# Patient Record
Sex: Female | Born: 1955 | Race: Black or African American | Hispanic: No | Marital: Married | Smoking: Never smoker
Health system: Southern US, Community
[De-identification: ages and names within clinical notes are randomized; demographics above are authoritative.]

## PROBLEM LIST (undated history)

## (undated) DIAGNOSIS — E119 Type 2 diabetes mellitus without complications: Secondary | ICD-10-CM

## (undated) DIAGNOSIS — B029 Zoster without complications: Secondary | ICD-10-CM

## (undated) HISTORY — PX: CHOLECYSTECTOMY: SHX55

---

## 2000-12-30 ENCOUNTER — Other Ambulatory Visit: Admission: RE | Admit: 2000-12-30 | Discharge: 2000-12-30 | Payer: Self-pay | Admitting: Specialist

## 2001-01-18 ENCOUNTER — Encounter: Payer: Self-pay | Admitting: Internal Medicine

## 2001-01-18 ENCOUNTER — Ambulatory Visit (HOSPITAL_COMMUNITY): Admission: RE | Admit: 2001-01-18 | Discharge: 2001-01-18 | Payer: Self-pay | Admitting: Internal Medicine

## 2002-01-18 ENCOUNTER — Ambulatory Visit (HOSPITAL_COMMUNITY): Admission: RE | Admit: 2002-01-18 | Discharge: 2002-01-18 | Payer: Self-pay | Admitting: Family Medicine

## 2002-01-18 ENCOUNTER — Encounter: Payer: Self-pay | Admitting: Family Medicine

## 2003-01-19 ENCOUNTER — Encounter: Payer: Self-pay | Admitting: Family Medicine

## 2003-01-19 ENCOUNTER — Ambulatory Visit (HOSPITAL_COMMUNITY): Admission: RE | Admit: 2003-01-19 | Discharge: 2003-01-19 | Payer: Self-pay | Admitting: Family Medicine

## 2003-06-17 ENCOUNTER — Emergency Department (HOSPITAL_COMMUNITY): Admission: EM | Admit: 2003-06-17 | Discharge: 2003-06-18 | Payer: Self-pay | Admitting: Emergency Medicine

## 2004-10-30 ENCOUNTER — Emergency Department (HOSPITAL_COMMUNITY): Admission: EM | Admit: 2004-10-30 | Discharge: 2004-10-30 | Payer: Self-pay | Admitting: Emergency Medicine

## 2006-02-06 ENCOUNTER — Ambulatory Visit (HOSPITAL_COMMUNITY): Admission: RE | Admit: 2006-02-06 | Discharge: 2006-02-06 | Payer: Self-pay | Admitting: Family Medicine

## 2007-03-01 ENCOUNTER — Ambulatory Visit (HOSPITAL_COMMUNITY): Admission: RE | Admit: 2007-03-01 | Discharge: 2007-03-01 | Payer: Self-pay | Admitting: Family Medicine

## 2008-03-01 ENCOUNTER — Ambulatory Visit (HOSPITAL_COMMUNITY): Admission: RE | Admit: 2008-03-01 | Discharge: 2008-03-01 | Payer: Self-pay | Admitting: Family Medicine

## 2008-04-26 ENCOUNTER — Emergency Department (HOSPITAL_COMMUNITY): Admission: EM | Admit: 2008-04-26 | Discharge: 2008-04-26 | Payer: Self-pay | Admitting: Emergency Medicine

## 2008-12-26 ENCOUNTER — Inpatient Hospital Stay (HOSPITAL_COMMUNITY): Admission: EM | Admit: 2008-12-26 | Discharge: 2008-12-31 | Payer: Self-pay | Admitting: Emergency Medicine

## 2008-12-26 ENCOUNTER — Ambulatory Visit: Payer: Self-pay | Admitting: Pulmonary Disease

## 2009-05-09 ENCOUNTER — Ambulatory Visit (HOSPITAL_COMMUNITY): Admission: RE | Admit: 2009-05-09 | Discharge: 2009-05-09 | Payer: Self-pay | Admitting: Obstetrics & Gynecology

## 2009-06-18 ENCOUNTER — Ambulatory Visit (HOSPITAL_BASED_OUTPATIENT_CLINIC_OR_DEPARTMENT_OTHER): Admission: RE | Admit: 2009-06-18 | Discharge: 2009-06-18 | Payer: Self-pay | Admitting: Urology

## 2010-05-10 ENCOUNTER — Ambulatory Visit (HOSPITAL_COMMUNITY): Admission: RE | Admit: 2010-05-10 | Discharge: 2010-05-10 | Payer: Self-pay | Admitting: Obstetrics & Gynecology

## 2010-08-19 ENCOUNTER — Encounter: Payer: Self-pay | Admitting: Obstetrics & Gynecology

## 2010-10-30 LAB — GLUCOSE, CAPILLARY: Glucose-Capillary: 221 mg/dL — ABNORMAL HIGH (ref 70–99)

## 2010-10-30 LAB — POCT I-STAT 4, (NA,K, GLUC, HGB,HCT): HCT: 42 % (ref 36.0–46.0)

## 2010-11-04 LAB — BASIC METABOLIC PANEL
BUN: 14 mg/dL (ref 6–23)
BUN: 22 mg/dL (ref 6–23)
BUN: 25 mg/dL — ABNORMAL HIGH (ref 6–23)
BUN: 26 mg/dL — ABNORMAL HIGH (ref 6–23)
BUN: 4 mg/dL — ABNORMAL LOW (ref 6–23)
BUN: 6 mg/dL (ref 6–23)
BUN: 9 mg/dL (ref 6–23)
CO2: 15 mEq/L — ABNORMAL LOW (ref 19–32)
CO2: 16 mEq/L — ABNORMAL LOW (ref 19–32)
CO2: 18 mEq/L — ABNORMAL LOW (ref 19–32)
CO2: 19 mEq/L (ref 19–32)
CO2: 20 mEq/L (ref 19–32)
CO2: 20 mEq/L (ref 19–32)
CO2: 22 mEq/L (ref 19–32)
CO2: 23 mEq/L (ref 19–32)
CO2: 29 mEq/L (ref 19–32)
CO2: 6 mEq/L — CL (ref 19–32)
Calcium: 7.2 mg/dL — ABNORMAL LOW (ref 8.4–10.5)
Calcium: 7.4 mg/dL — ABNORMAL LOW (ref 8.4–10.5)
Calcium: 7.5 mg/dL — ABNORMAL LOW (ref 8.4–10.5)
Calcium: 7.5 mg/dL — ABNORMAL LOW (ref 8.4–10.5)
Calcium: 8.1 mg/dL — ABNORMAL LOW (ref 8.4–10.5)
Calcium: 8.7 mg/dL (ref 8.4–10.5)
Calcium: 9 mg/dL (ref 8.4–10.5)
Chloride: 102 mEq/L (ref 96–112)
Chloride: 105 mEq/L (ref 96–112)
Chloride: 106 mEq/L (ref 96–112)
Chloride: 108 mEq/L (ref 96–112)
Chloride: 110 mEq/L (ref 96–112)
Chloride: 111 mEq/L (ref 96–112)
Chloride: 115 mEq/L — ABNORMAL HIGH (ref 96–112)
Chloride: 118 mEq/L — ABNORMAL HIGH (ref 96–112)
Chloride: 120 mEq/L — ABNORMAL HIGH (ref 96–112)
Chloride: 124 mEq/L — ABNORMAL HIGH (ref 96–112)
Creatinine, Ser: 0.58 mg/dL (ref 0.4–1.2)
Creatinine, Ser: 0.61 mg/dL (ref 0.4–1.2)
Creatinine, Ser: 0.69 mg/dL (ref 0.4–1.2)
Creatinine, Ser: 0.93 mg/dL (ref 0.4–1.2)
Creatinine, Ser: 1.1 mg/dL (ref 0.4–1.2)
Creatinine, Ser: 1.55 mg/dL — ABNORMAL HIGH (ref 0.4–1.2)
Creatinine, Ser: 1.55 mg/dL — ABNORMAL HIGH (ref 0.4–1.2)
Creatinine, Ser: 1.66 mg/dL — ABNORMAL HIGH (ref 0.4–1.2)
GFR calc Af Amer: 42 mL/min — ABNORMAL LOW (ref >60)
GFR calc Af Amer: >60 mL/min (ref >60)
GFR calc Af Amer: >60 mL/min (ref >60)
GFR calc Af Amer: >60 mL/min (ref >60)
GFR calc Af Amer: >60 mL/min (ref >60)
GFR calc Af Amer: >60 mL/min (ref >60)
GFR calc Af Amer: >60 mL/min (ref >60)
GFR calc Af Amer: >60 mL/min (ref >60)
GFR calc Af Amer: >60 mL/min (ref >60)
GFR calc Af Amer: >60 mL/min (ref >60)
GFR calc Af Amer: >60 mL/min (ref >60)
GFR calc non Af Amer: 35 mL/min — ABNORMAL LOW (ref >60)
GFR calc non Af Amer: 55 mL/min — ABNORMAL LOW (ref >60)
GFR calc non Af Amer: >60 mL/min (ref >60)
GFR calc non Af Amer: >60 mL/min (ref >60)
GFR calc non Af Amer: >60 mL/min (ref >60)
GFR calc non Af Amer: >60 mL/min (ref >60)
GFR calc non Af Amer: >60 mL/min (ref >60)
Glucose, Bld: 163 mg/dL — ABNORMAL HIGH (ref 70–99)
Glucose, Bld: 177 mg/dL — ABNORMAL HIGH (ref 70–99)
Glucose, Bld: 232 mg/dL — ABNORMAL HIGH (ref 70–99)
Glucose, Bld: 236 mg/dL — ABNORMAL HIGH (ref 70–99)
Glucose, Bld: 242 mg/dL — ABNORMAL HIGH (ref 70–99)
Glucose, Bld: 318 mg/dL — ABNORMAL HIGH (ref 70–99)
Glucose, Bld: 585 mg/dL (ref 70–99)
Potassium: 3.1 mEq/L — ABNORMAL LOW (ref 3.5–5.1)
Potassium: 3.1 mEq/L — ABNORMAL LOW (ref 3.5–5.1)
Potassium: 3.3 mEq/L — ABNORMAL LOW (ref 3.5–5.1)
Potassium: 3.5 mEq/L (ref 3.5–5.1)
Potassium: 3.7 mEq/L (ref 3.5–5.1)
Potassium: 3.7 mEq/L (ref 3.5–5.1)
Potassium: 3.8 mEq/L (ref 3.5–5.1)
Potassium: 4.2 mEq/L (ref 3.5–5.1)
Potassium: 5 mEq/L (ref 3.5–5.1)
Potassium: 5.5 mEq/L — ABNORMAL HIGH (ref 3.5–5.1)
Sodium: 133 mEq/L — ABNORMAL LOW (ref 135–145)
Sodium: 137 mEq/L (ref 135–145)
Sodium: 138 mEq/L (ref 135–145)
Sodium: 138 mEq/L (ref 135–145)
Sodium: 141 mEq/L (ref 135–145)
Sodium: 142 mEq/L (ref 135–145)
Sodium: 144 mEq/L (ref 135–145)
Sodium: 144 mEq/L (ref 135–145)
Sodium: 144 mEq/L (ref 135–145)
Sodium: 147 mEq/L — ABNORMAL HIGH (ref 135–145)

## 2010-11-04 LAB — POCT I-STAT, CHEM 8
BUN: 31 mg/dL — ABNORMAL HIGH (ref 6–23)
Creatinine, Ser: 1.2 mg/dL (ref 0.4–1.2)
Potassium: 5.6 mEq/L — ABNORMAL HIGH (ref 3.5–5.1)
Sodium: 134 mEq/L — ABNORMAL LOW (ref 135–145)
TCO2: 6 mmol/L (ref 0–100)

## 2010-11-04 LAB — PHOSPHORUS
Phosphorus: 1.2 mg/dL — ABNORMAL LOW (ref 2.3–4.6)
Phosphorus: 1.9 mg/dL — ABNORMAL LOW (ref 2.3–4.6)
Phosphorus: 2.4 mg/dL (ref 2.3–4.6)
Phosphorus: 2.6 mg/dL (ref 2.3–4.6)
Phosphorus: 3 mg/dL (ref 2.3–4.6)
Phosphorus: 5.2 mg/dL — ABNORMAL HIGH (ref 2.3–4.6)
Phosphorus: <1 mg/dL — CL (ref 2.3–4.6)

## 2010-11-04 LAB — GLUCOSE, CAPILLARY
Glucose-Capillary: 132 mg/dL — ABNORMAL HIGH (ref 70–99)
Glucose-Capillary: 132 mg/dL — ABNORMAL HIGH (ref 70–99)
Glucose-Capillary: 152 mg/dL — ABNORMAL HIGH (ref 70–99)
Glucose-Capillary: 171 mg/dL — ABNORMAL HIGH (ref 70–99)
Glucose-Capillary: 183 mg/dL — ABNORMAL HIGH (ref 70–99)
Glucose-Capillary: 225 mg/dL — ABNORMAL HIGH (ref 70–99)
Glucose-Capillary: 239 mg/dL — ABNORMAL HIGH (ref 70–99)
Glucose-Capillary: 259 mg/dL — ABNORMAL HIGH (ref 70–99)
Glucose-Capillary: 260 mg/dL — ABNORMAL HIGH (ref 70–99)
Glucose-Capillary: 289 mg/dL — ABNORMAL HIGH (ref 70–99)
Glucose-Capillary: 306 mg/dL — ABNORMAL HIGH (ref 70–99)
Glucose-Capillary: 307 mg/dL — ABNORMAL HIGH (ref 70–99)
Glucose-Capillary: 326 mg/dL — ABNORMAL HIGH (ref 70–99)
Glucose-Capillary: 356 mg/dL — ABNORMAL HIGH (ref 70–99)
Glucose-Capillary: 476 mg/dL — ABNORMAL HIGH (ref 70–99)
Glucose-Capillary: 98 mg/dL (ref 70–99)
Glucose-Capillary: >600 mg/dL (ref 70–99)

## 2010-11-04 LAB — TROPONIN I: Troponin I: 0.03 ng/mL (ref 0.00–0.06)

## 2010-11-04 LAB — CULTURE, BLOOD (ROUTINE X 2)

## 2010-11-04 LAB — URINALYSIS, ROUTINE W REFLEX MICROSCOPIC
Ketones, ur: >80 mg/dL — AB
Leukocytes, UA: NEGATIVE
Nitrite: NEGATIVE
Specific Gravity, Urine: 1.022 (ref 1.005–1.030)
Urobilinogen, UA: 0.2 mg/dL (ref 0.0–1.0)
pH: 5 (ref 5.0–8.0)

## 2010-11-04 LAB — DIFFERENTIAL
Basophils Absolute: 0 10*3/uL (ref 0.0–0.1)
Eosinophils Relative: 0 % (ref 0–5)
Lymphocytes Relative: 6 % — ABNORMAL LOW (ref 12–46)
Monocytes Relative: 3 % (ref 3–12)
Neutrophils Relative %: 91 % — ABNORMAL HIGH (ref 43–77)

## 2010-11-04 LAB — POCT I-STAT 3, ART BLOOD GAS (G3+)
Acid-base deficit: 21 mmol/L — ABNORMAL HIGH (ref 0.0–2.0)
Acid-base deficit: 29 mmol/L — ABNORMAL HIGH (ref 0.0–2.0)
Bicarbonate: 2.3 mEq/L — ABNORMAL LOW (ref 20.0–24.0)
Bicarbonate: 5.3 mEq/L — ABNORMAL LOW (ref 20.0–24.0)
O2 Saturation: 98 %
Patient temperature: 37
TCO2: 6 mmol/L (ref 0–100)
TCO2: <5 mmol/L (ref 0–100)
pO2, Arterial: 139 mmHg — ABNORMAL HIGH (ref 80.0–100.0)
pO2, Arterial: 147 mmHg — ABNORMAL HIGH (ref 80.0–100.0)

## 2010-11-04 LAB — URINE CULTURE: Colony Count: >=100000

## 2010-11-04 LAB — KETONES, QUALITATIVE

## 2010-11-04 LAB — MAGNESIUM
Magnesium: 2.2 mg/dL (ref 1.5–2.5)
Magnesium: 2.4 mg/dL (ref 1.5–2.5)

## 2010-11-04 LAB — CBC
HCT: 29.2 % — ABNORMAL LOW (ref 36.0–46.0)
Hemoglobin: 10.3 g/dL — ABNORMAL LOW (ref 12.0–15.0)
Hemoglobin: 11.1 g/dL — ABNORMAL LOW (ref 12.0–15.0)
MCHC: 34 g/dL (ref 30.0–36.0)
MCHC: 34.8 g/dL (ref 30.0–36.0)
MCV: 91 fL (ref 78.0–100.0)
Platelets: 176 10*3/uL (ref 150–400)
Platelets: 331 10*3/uL (ref 150–400)
RBC: 3.21 MIL/uL — ABNORMAL LOW (ref 3.87–5.11)
RBC: 3.57 MIL/uL — ABNORMAL LOW (ref 3.87–5.11)
RDW: 12.7 % (ref 11.5–15.5)
RDW: 12.8 % (ref 11.5–15.5)
WBC: 18.9 10*3/uL — ABNORMAL HIGH (ref 4.0–10.5)
WBC: 8.1 10*3/uL (ref 4.0–10.5)

## 2010-11-04 LAB — POCT I-STAT 3, VENOUS BLOOD GAS (G3P V)
Bicarbonate: 5.8 mEq/L — ABNORMAL LOW (ref 20.0–24.0)
O2 Saturation: 84 %
TCO2: 7 mmol/L (ref 0–100)
pCO2, Ven: 33.4 mmHg — ABNORMAL LOW (ref 45.0–50.0)
pH, Ven: 6.846 — CL (ref 7.250–7.300)

## 2010-11-04 LAB — C-PEPTIDE: C-Peptide: 0.12 ng/mL — ABNORMAL LOW (ref 0.80–3.90)

## 2010-11-04 LAB — POCT CARDIAC MARKERS: Myoglobin, poc: 86.3 ng/mL (ref 12–200)

## 2010-11-04 LAB — CARDIAC PANEL(CRET KIN+CKTOT+MB+TROPI)
CK, MB: 2.6 ng/mL (ref 0.3–4.0)
Relative Index: INVALID (ref 0.0–2.5)
Total CK: 78 U/L (ref 7–177)
Troponin I: 0.01 ng/mL (ref 0.00–0.06)

## 2010-11-04 LAB — PROTIME-INR: Prothrombin Time: 16.1 seconds — ABNORMAL HIGH (ref 11.6–15.2)

## 2010-11-04 LAB — HEPATIC FUNCTION PANEL
AST: 27 U/L (ref 0–37)
Bilirubin, Direct: 0.2 mg/dL (ref 0.0–0.3)
Indirect Bilirubin: 1.6 mg/dL — ABNORMAL HIGH (ref 0.3–0.9)
Total Bilirubin: 1.8 mg/dL — ABNORMAL HIGH (ref 0.3–1.2)

## 2010-11-04 LAB — CK TOTAL AND CKMB (NOT AT ARMC)
CK, MB: 2.8 ng/mL (ref 0.3–4.0)
Relative Index: INVALID (ref 0.0–2.5)
Total CK: 44 U/L (ref 7–177)

## 2010-11-04 LAB — URINE MICROSCOPIC-ADD ON

## 2010-11-04 LAB — LIPASE, BLOOD: Lipase: 23 U/L (ref 11–59)

## 2010-12-10 NOTE — H&P (Signed)
NAMECHARDA, Helen [name]              ACCOUNT NO.:  [id number]   MEDICAL RECORD NO.:  [id number]          PATIENT TYPE:  INP   LOCATION:  2115                         FACILITY:  [facility]   PHYSICIAN:  Leslye Peer, MD    DATE OF BIRTH:  10/03/1955   DATE OF ADMISSION:  12/26/2008  DATE OF DISCHARGE:                              HISTORY & PHYSICAL   CHIEF COMPLAINT:  Back pain.   HISTORY OF PRESENT ILLNESS:  This is a 55 year old African American  female who reports that she was in her usual state of health until  approximately 2 days ago on the Dec 24, 2008, when she began to notice  mid thoracic back pain primarily on the left side.  She also noted  associated left lower extremity weakness accompanying this new finding.  The pain progressed.  She developed associated nausea and vomiting over  the course of the day on Dec 25, 2008.  She did not take any of her  medications that day secondary to nausea and poor p.o. intake.  She  presented to the emergency room today on December 26, 2008, with complaint of  intractable back pain, nausea, and vomiting.  Upon presentation in the  emergency room, she was awake, oriented, and alert but writhing in pain.  She had point tenderness on the mid thoracic spine primarily on the  left.  Diagnostic evaluation in the emergency room yielded a capillary  blood glucose of 600, a venous pH of 6.8, and positive serum ketones.  IV access was obtained.  An initial fluid resuscitation and insulin  supplementation was started.  Because of severe back discomfort, there  was concern for aortic dissection by the emergency room physician and  therefore CT angio of chest, abdomen, and pelvis were obtained.  This  was negative for abdominal aortic dissection or aneurysm, there was  suggestion of antritis, diffuse pancreatic atrophy, and bilateral UPJ  obstructions or prominent parapelvic cysts greater on the left.  This  was discussed in depth with Dr. Fredia Sorrow with  Radiology who also noted  no ureteral calculi or masses, and felt that these findings may indeed  be chronic, but recommended further evaluation with renal ultrasound.  Given the severe metabolic derangements, persistent tachycardia, and  altered sensorium, the Pulmonary Critical Care Service was asked to  evaluate and admit.   PAST MEDICAL HISTORY:  Hypertension and diabetes.   SOCIAL HISTORY:  She is a Chartered loss adjuster in high school, she denies  alcohol, drug abuse, physically active, walks for exercise, she is a  nonsmoker.   FAMILY HISTORY:  Currently unavailable.   HOME MEDICATIONS:  1. NovoLog 12 units 3 times a day.  2. Lantus insulin 24 units at bedtime.  3. Altace 20 mg daily.  4. Aspirin 81 mg daily.   ALLERGIES:  No known drug allergies.   REVIEW OF SYSTEMS:  CONSTITUTIONAL:  Denies weight loss, fevers, or  chills, has had generalized discomfort, particular left-sided weakness.  EYES:  Denies visual changes, or any pertinent positives with eyes.  Denies pertinent positives with skin, ears, nose, and throat.  Denies  sore throat, tinnitus, bloody nose, hearing loss, or sinusitis.  NEUROLOGIC:  Denies migraines, has had mild headache and dizziness, has  had some gait disturbance, and left lower extremity weakness.  Denies  shortness of breath, cough, hemoptysis, wheezing, pleurisy from a  respiratory standpoint.  From endocrine standpoint, does complain of  thirsty and sore throat.  CHEST:  Denies chest pain, palpitations,  edema, or syncope.  PSYCHIATRIC:  Denies anxiety.  GI:  Has had nausea  and vomiting.  HEM:  Denies any bruising, blood clotting, or swollen  glands.  GU:  Has had no dysuria, hematuria, frequency, or hesitancy.   CURRENT PHYSICAL EXAMINATION:  VITAL SIGNS:  Temperature 96, heart rate  120-130, sinus rhythm, blood pressure 120/88, respirations 30s, and  saturations 100% on 2 L via nasal cannula.  GENERAL:  The patient is currently awake, arousable,  reporting back  discomfort which is mildly alleviated with positional changes.  HEENT:  Mucous membranes are dry.  NECK:  The neck veins are flat.  There is no adenopathy.  PULMONARY:  Clear to auscultation.  CARDIAC:  Tachy, regular rate and rhythm without murmur, rub, or gallop.  EXTREMITIES:  Warm with 2 to 3 + pulses.  No edema.  ABDOMEN:  Soft and nontender and without organomegaly.  GU:  Currently waiting and due to void.  NEUROLOGICAL:  Awake and oriented x3.  Speech is a bit slurred, but this  is rapidly improving during the emergency room evaluation.  She does  report left-sided weakness in the left lower extremity and this is re-  demonstrated on physical exam.   LABORATORY DATA:  Hemoglobin 15, hematocrit 46.7, platelet count 331,  white blood cell count 18.9, neutrophils 91%.  Troponin I is less than  0.05.  Sodium 134, potassium 5.6, chloride 110, CO2 of 6, BUN 31,  creatinine 1.2, glucose initially 626, anion gap 18.  Blood acetone  positive, AST 27, ALT 22, lipase 23.  Urine ketones greater than 80.  Venous pH 6.8.  Urinalysis demonstrates rare bacteria.   DIAGNOSTICS:  Chest x-ray personally reviewed demonstrates a clear chest  x-ray without infiltrates, or edema.  Right internal jugular vein  catheter is placed and in satisfactory position.  CT of abdomen and  pelvis demonstrates both renal collecting systems dilated, the left  greater than right.  There is no calculi or visual obstruction.  This is  felt to be more parapelvic cysts on the left.  There is some question of  mild hydro of the right, but this could also be chronic changes.  This  was discussed at length with Dr. Fredia Sorrow while reviewing the films.  There is some question as to whether or not these may actually be  chronic changes.   IMPRESSION AND PLAN:  1. Diabetic ketoacidosis with profound metabolic acidosis.  Plan of      course is to initiate aggressive volume resuscitation, as well as       insulin supplementation.  This has already been initiated in the      emergency room.  Suspect occult infection is probably the driving      forced behind this.  Top on differential diagnosis includes      pyelonephritis, however, this is doubtful following discussion with      Radiology, and also could certainly consider epidural abscess.      Plan at this point will be to admit to the intensive care, central      access has been obtained.  She will be aggressively volume      resuscitated.  Close observation of chemistries will be obtained      every 2 hours, insulin drip infusion is started and her blood      glucose is already down in the 400 range.  She will require close      monitoring of her metabolic status, therefore in addition to the      above plan she will be admitted to the intensive care where she      will be monitored closely.  She is currently awaiting a Foley      catheter to be placed.  2. Systemic inflammatory response syndrome, sepsis.  Not sure what the      source is at this point.  Again, as mentioned above, certainly      think top on the list is epidural abscess as previously mentioned,      as well as ruling out pyelonephritis.  From this standpoint, blood      cultures will be obtained, abdominal ultrasound will be obtained,      MRI of thoracic spine has been ordered and will be obtained, and      aggressive resuscitative measures will be started.  As mentioned      before, central access has already been obtained and volume      resuscitative measures have been initiated.  Blood cultures have      been obtain.  Urine cultures are pending, antibiotics have been      initiated.  She has already received vancomycin and Zosyn.  3. Back pain.  Plan for this is to obtain MRI, and be very gentle with      narcotic administration given altered sensorium in the setting of      diabetic ketoacidosis.  4. Nausea and vomiting.  Plan for this is to give Zofran.  5.  Hyperkalemia.  No doubt this is secondary acidotic state.  We will      not do anything at this point as we fluid resuscitate, the      potassium of dropped and suspect will actually be replenishing      electrolytes in the next few hours.   DISPOSITION:  Helen [name] is currently awaiting transfer to the  intensive care.  She is critically ill with further diagnostic  evaluation pending.  However, she does appear to have slowly improved  and responded to resuscitation efforts in the emergency room.   Please note 60 minutes of critical care time applied to this dictation.      Helen Resides, NP      Leslye Peer, MD  Electronically Signed    PB/MEDQ  D:  12/26/2008  T:  12/27/2008  Job:  [id number]

## 2010-12-10 NOTE — Discharge Summary (Signed)
Helen [name], Helen [name]              ACCOUNT NO.:  [id number]   MEDICAL RECORD NO.:  [id number]          PATIENT TYPE:  INP   LOCATION:  6703                         FACILITY:  [facility]   PHYSICIAN:  Charlestine Massed, MDDATE OF BIRTH:  02/07/1956   DATE OF ADMISSION:  12/26/2008  DATE OF DISCHARGE:  12/31/2008                               DISCHARGE SUMMARY   PRIMARY CARE PHYSICIAN:  Dr. Brendia Sacks of [facility].   UROLOGIST:  Martina Sinner, MD, of [facility].   ENDOCRINOLOGIST:  Tera Mater. Evlyn Kanner, MD, of [facility].   REASON FOR ADMISSION:  Back pain and nausea  and vomiting.   DISCHARGE DIAGNOSES:  1. Diabetes mellitus - the patient had type 2 diabetes mellitus      previously, but currently she has ended up in a state where she has      insulin dependent diabetes mellitus as her C-peptide level is very      low.  2. Hypertension, currently stable.  3. Bilateral hydronephrosis and ultrasonogram with normal renal      function.  4. Urinary tract infection with Proteus.  5. Dyslipidemia - previous diagnosis.   DISCHARGE MEDICATIONS:  1. Keflex 500 mg p.o. b.i.d. for 9 more days.  2. Lantus insulin 28 units at bedtime subcutaneously.  3. NovoLog coverage sliding scale q.a.c. and at bedtime at moderate      scale.  4. Lisinopril 5 mg p.o. daily.  5. Zocor 20 mg p.o. at bedtime  6. Aspirin 81 mg p.o. daily.   HOSPITAL COURSE:  1. Diabetes/diabetic ketoacidosis and the systemic inflammatory      response syndrome.  The patient was admitted on December 26, 2008,      directly by Critical Care as the patient was found to be having      very high blood sugars with profound metabolic acidosis and      diabetic ketoacidosis.  She also had sepsis and systemic      inflammatory response syndrome secondary to urinary tract      infection.  The patient was admitted to ICU, continued on critical      care management, was placed on IV fluids daily  and IV insulin drip.      The patient's condition improved considerably.  She was found to      have urinary tract infection with Proteus mirabilis was started      which is sensitive to all medications, so she initially was started      on Zosyn and after the sensitivities was changed to Ancef.  The      patient's condition improved considerably.  After that, she was      transferred out of the ICU once her condition stabilized.  She was      followed up by me on the medical floor.  She has bilateral      hydronephrosis with normal functioning kidneys.  The hydronephrosis      looked more of chronic.  She does not have any active symptoms at      this time with regards to  that.  The patient's hydronephrosis was      diagnosed on December 26, 2008, itself and was discussed by Critical      Care with radiologist, and in view of the fact that she has      normally functioning kidneys no intervention was done at that time.      The patient's C-peptide level was checked which is 0.12 which is an      extremely lower level which makes [place] assume the fact that the      patient has very low beta-cell results at this time and so will not      benefit with any of the medications of glitazones or with      metformin.  The fact whether she will respond to Januvia is also      questionable with such a low C-peptide level, so I am discharging      her with medications of Lantus and insulin sliding scale at this      time.  She has had previously episodes of nausea and vomiting      whenever she attempted to take Januvia also, so further change in      medications can be done by the primary care doctor at his or her      discretion.  2. Urinary tract infection with bilateral hydronephrosis.  The patient      had a sonogram done on December 26, 2008, at the time of admission which      showed right-sided hydronephrosis and the left side kidney even      though looked big was possibly secondary to a big cystic  structure      presently on the kidney which was communicating with the pelvis,      and as per the critical care attending notes it was well clearly      documented that condition was discussed well and no further      attempts were made to do anything at that time because the      patient's kidney function was stable and there was no deterioration      in renal function noted at any time.  Her urinary tract infection      responded very well to IV antibiotics.  She was switched from broad-      spectrum antibiotics to Ancef, and currently she will continue      antibiotics for 9 more days in view of her complicated urinary      tract.  3. Hypertension.  Her blood pressures have been stable throughout her      admission without any antibiotic agents.  She was taking Altace      before.  Currently, she has been switched to lisinopril 5 mg p.o.      daily.  She has urine proteinuria also and so lisinopril will be      helpful for her kidneys at this time.  Blood pressure is otherwise      stable.  4. Dyslipidemia.  The patient had a prior diagnosis of dyslipidemia.      Currently, in view of her medical conditions, taking a lipid      profile is not a good idea at this time.  She can have a lipid      profile done as outpatient.  We will continue the Zocor which she      was on and further decisions will be made by her primary care  physician.  5. For urology appointment as mentioned earlier about the bilateral      hydronephrosis issue, I discussed with Dr. Sherron Monday, urologist of      [facility], who said aspiration is currently asymptomatic at      this time and renal functions being totally stable for many days.      There is no need for acute intervention at this time, and she can      see him in the office within the next week, and so the patient has      been given the phone number to contact [facility] to get an      appointment for Dr. Sherron Monday and see him in  the office.  The      patient has also been educated about the symptoms that can happen      due to an enlarging kidney which include back pain, nausea and      vomiting, and she has been educated to come to the emergency room      if any of those symptoms occur.   DISPOSITION:  Discharge back home.   FOLLOWUP:  1. Followup with Dr. Brendia Sacks of [facility] in 1-2      weeks.  2. Followup to consult Dr. Sherron Monday, [facility], within the      next 1-2 weeks for consultations.  3. Consult Dr. Adrian Prince, endocrinologist, at [facility] in 1-2 weeks for further management of diabetes.   TESTS DONE DURING THIS ADMISSION:  1. Renal ultrasound done on December 28, 2008, showed bilateral      hydronephrosis, right kidney 12.6, left kidney 12.2 cm, more on the      left than the right.  No solid mass or calculus is evident.  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  No significant      dilatation of the collecting system was seen on the left kidney.  2. CAT scan of the chest, abdomen, and pelvis.  CAT scan of the chest      was reported as a limited examination to find if there is aortic      dissection due to the presence of motion artifact and bilateral      ureteropelvic junction obstructions and prominent parapelvic cysts      greater on the left, antritis present.  No abdominal aortic      dissection or aneurysm, diffuse pancreatic atrophy.  No acute      pelvic abnormality.  3. Chest x-ray done on June showed no acute findings nor of any mass      or opacities present.   Lab tests done during this admission of significance:  1. C-peptide is  0.12, normal is 0.8-3.9.  2. BMET done on December 31, 2008, sodium 142, potassium 4.2, chloride 108,      bicarb 29, BUN 5, creatinine 0.58, glucose 217, and calcium was      9.0.  3. Urine culture done on December 26, 2008, shows more than 100,000  colonies of Proteus mirabilis sensitive to ampicillin, cephazolin,      ceftriaxone, ciprofloxacin, gentamicin, levofloxacin, tobramycin,      and Bactrim.   A total of 40 minutes was spent on the discharge.       Charlestine Massed, MD  Electronically Signed     UT/MEDQ  D:  12/31/2008  T:  01/01/2009  Job:  [id number]   cc:   Martina Sinner, MD  Tera Mater. Evlyn Kanner, M.D.

## 2011-04-17 ENCOUNTER — Other Ambulatory Visit (HOSPITAL_COMMUNITY): Payer: Self-pay | Admitting: Obstetrics & Gynecology

## 2011-04-17 DIAGNOSIS — Z139 Encounter for screening, unspecified: Secondary | ICD-10-CM

## 2011-04-24 ENCOUNTER — Other Ambulatory Visit: Payer: Self-pay | Admitting: Family Medicine

## 2011-04-24 ENCOUNTER — Ambulatory Visit
Admission: RE | Admit: 2011-04-24 | Discharge: 2011-04-24 | Disposition: A | Discharge status: Home / Self Care | Payer: BC Managed Care – PPO | Source: Ambulatory Visit | Attending: Family Medicine | Admitting: Family Medicine

## 2011-04-24 DIAGNOSIS — M5410 Radiculopathy, site unspecified: Secondary | ICD-10-CM

## 2011-04-24 DIAGNOSIS — M549 Dorsalgia, unspecified: Secondary | ICD-10-CM

## 2011-04-28 LAB — URINALYSIS, ROUTINE W REFLEX MICROSCOPIC
Glucose, UA: NEGATIVE
Hgb urine dipstick: NEGATIVE
Ketones, ur: 40 — AB
Protein, ur: NEGATIVE
Urobilinogen, UA: 0.2

## 2011-05-13 ENCOUNTER — Ambulatory Visit (HOSPITAL_COMMUNITY): Payer: BC Managed Care – PPO

## 2011-06-16 ENCOUNTER — Ambulatory Visit (HOSPITAL_COMMUNITY)
Admission: RE | Admit: 2011-06-16 | Discharge: 2011-06-16 | Disposition: A | Discharge status: Home / Self Care | Payer: BC Managed Care – PPO | Source: Ambulatory Visit | Attending: Obstetrics & Gynecology | Admitting: Obstetrics & Gynecology

## 2011-06-16 DIAGNOSIS — Z1231 Encounter for screening mammogram for malignant neoplasm of breast: Secondary | ICD-10-CM | POA: Insufficient documentation

## 2011-06-16 DIAGNOSIS — Z139 Encounter for screening, unspecified: Secondary | ICD-10-CM

## 2012-05-10 ENCOUNTER — Other Ambulatory Visit: Payer: Self-pay | Admitting: Family Medicine

## 2012-05-10 DIAGNOSIS — Z139 Encounter for screening, unspecified: Secondary | ICD-10-CM

## 2012-06-21 ENCOUNTER — Ambulatory Visit (HOSPITAL_COMMUNITY): Payer: BC Managed Care – PPO

## 2012-07-12 ENCOUNTER — Ambulatory Visit (HOSPITAL_COMMUNITY): Payer: BC Managed Care – PPO

## 2012-07-22 ENCOUNTER — Ambulatory Visit (HOSPITAL_COMMUNITY)
Admission: RE | Admit: 2012-07-22 | Discharge: 2012-07-22 | Disposition: A | Discharge status: Home / Self Care | Payer: BC Managed Care – PPO | Source: Ambulatory Visit | Attending: Family Medicine | Admitting: Family Medicine

## 2012-07-22 DIAGNOSIS — Z1231 Encounter for screening mammogram for malignant neoplasm of breast: Secondary | ICD-10-CM | POA: Insufficient documentation

## 2012-07-22 DIAGNOSIS — Z139 Encounter for screening, unspecified: Secondary | ICD-10-CM

## 2012-09-17 ENCOUNTER — Emergency Department (HOSPITAL_COMMUNITY)
Admission: EM | Admit: 2012-09-17 | Discharge: 2012-09-18 | Disposition: A | Discharge status: Home / Self Care | Payer: BC Managed Care – PPO | Attending: Emergency Medicine | Admitting: Emergency Medicine

## 2012-09-17 ENCOUNTER — Encounter (HOSPITAL_COMMUNITY): Payer: Self-pay | Admitting: Emergency Medicine

## 2012-09-17 DIAGNOSIS — Z79899 Other long term (current) drug therapy: Secondary | ICD-10-CM | POA: Insufficient documentation

## 2012-09-17 DIAGNOSIS — B029 Zoster without complications: Secondary | ICD-10-CM | POA: Insufficient documentation

## 2012-09-17 DIAGNOSIS — Y939 Activity, unspecified: Secondary | ICD-10-CM | POA: Insufficient documentation

## 2012-09-17 DIAGNOSIS — Z7982 Long term (current) use of aspirin: Secondary | ICD-10-CM | POA: Insufficient documentation

## 2012-09-17 DIAGNOSIS — X58XXXA Exposure to other specified factors, initial encounter: Secondary | ICD-10-CM | POA: Insufficient documentation

## 2012-09-17 DIAGNOSIS — Y929 Unspecified place or not applicable: Secondary | ICD-10-CM | POA: Insufficient documentation

## 2012-09-17 DIAGNOSIS — S39012A Strain of muscle, fascia and tendon of lower back, initial encounter: Secondary | ICD-10-CM

## 2012-09-17 DIAGNOSIS — Z794 Long term (current) use of insulin: Secondary | ICD-10-CM | POA: Insufficient documentation

## 2012-09-17 DIAGNOSIS — E119 Type 2 diabetes mellitus without complications: Secondary | ICD-10-CM | POA: Insufficient documentation

## 2012-09-17 DIAGNOSIS — IMO0002 Reserved for concepts with insufficient information to code with codable children: Secondary | ICD-10-CM | POA: Insufficient documentation

## 2012-09-17 HISTORY — DX: Zoster without complications: B02.9

## 2012-09-17 HISTORY — DX: Type 2 diabetes mellitus without complications: E11.9

## 2012-09-17 NOTE — ED Notes (Signed)
Pt c/o L lower back pain, pt states she was dx with shingles. Pt states she was seen by her PCP yesterday for same.

## 2012-09-17 NOTE — ED Notes (Signed)
Pt describes pain as "tightening", worse with lying flat

## 2012-09-18 MED ORDER — NAPROXEN 500 MG PO TABS: 500.0000 mg | ORAL_TABLET | Freq: Two times a day (BID) | ORAL | Status: DC

## 2012-09-18 MED ORDER — HYDROCODONE-ACETAMINOPHEN 5-325 MG PO TABS: 2.0000 | ORAL_TABLET | Freq: Four times a day (QID) | ORAL | Status: DC | PRN

## 2012-09-18 MED ORDER — KETOROLAC TROMETHAMINE 60 MG/2ML IM SOLN
60.0000 mg | Freq: Once | INTRAMUSCULAR | Status: AC
  Administered 2012-09-18: 60 mg via INTRAMUSCULAR
  Filled 2012-09-18: qty 2

## 2012-09-18 MED ORDER — METHOCARBAMOL 500 MG PO TABS: 500.0000 mg | ORAL_TABLET | Freq: Two times a day (BID) | ORAL | Status: DC

## 2012-09-18 NOTE — ED Provider Notes (Signed)
History     CSN: [id number]  Arrival date & time 09/17/12  2216   First MD Initiated Contact with Patient 09/17/12 2322      Chief Complaint  Patient presents with  . Back Pain    (Consider location/radiation/quality/duration/timing/severity/associated sxs/prior treatment) HPI Comments: This is a 57 year old female, who presents emergency department with chief complaint of back pain. Patient states that she was seen previously by her primary care provider, who diagnosed her with shingles, and she has been taking Valtrex, with no relief. However she states that there has never been any rash, and that the pain has been worsening. She states that the pain radiates down her left leg. She states the pain is worsened with certain movements. She states the pain is 9/10. It is also worsened with palpation. She's also tried taking naproxen with no relief. She denies any bowel or bladder incontinence, denies saddle anesthesia, or numbness or tingling of the extremities.  The history is provided by the patient. No language interpreter was used.    Past Medical History  Diagnosis Date  . Shingles   . Diabetes mellitus without complication     Past Surgical History  Procedure Laterality Date  . Cholecystectomy      No family history on file.  History  Substance Use Topics  . Smoking status: Never Smoker   . Smokeless tobacco: Not on file  . Alcohol Use: No    OB History   Grav Para Term Preterm Abortions TAB SAB Ect Mult Living                  Review of Systems  All other systems reviewed and are negative.    Allergies  Review of patient's allergies indicates no known allergies.  Home Medications   Current Outpatient Rx  Name  Route  Sig  Dispense  Refill  . aspirin EC 81 MG tablet   Oral   Take 81 mg by mouth every evening.         . cholecalciferol (VITAMIN D) 1000 UNITS tablet   Oral   Take 1,000 Units by mouth every morning.         Marland Kitchen LANTUS SOLOSTAR 100  UNIT/ML injection   Subcutaneous   Inject 30 Units into the skin at bedtime.          Marland Kitchen lisinopril (PRINIVIL,ZESTRIL) 5 MG tablet   Oral   Take 5 mg by mouth every evening.         . naproxen sodium (ANAPROX) 220 MG tablet   Oral   Take 440 mg by mouth 4 (four) times daily as needed (for pain).         . NOVOLOG FLEXPEN 100 UNIT/ML injection   Subcutaneous   Inject 5-25 Units into the skin 3 (three) times daily with meals. According to sliding scale         . valACYclovir (VALTREX) 1000 MG tablet   Oral   Take 1,000 mg by mouth 3 (three) times daily.           BP 112/70  Pulse 78  Temp(Src) 98.4 F (36.9 C) (Oral)  Resp 18  Ht 5\' 5"  (1.651 m)  Wt 198 lb (89.812 kg)  BMI 32.95 kg/m2  SpO2 98%  Physical Exam  Nursing note and vitals reviewed. Constitutional: She is oriented to person, place, and time. She appears well-developed and well-nourished.  HENT:  Head: Normocephalic and atraumatic.  Eyes: Conjunctivae and EOM are normal.  Neck:  Normal range of motion.  Cardiovascular: Normal rate.   Pulmonary/Chest: Effort normal.  Abdominal: She exhibits no distension.  Musculoskeletal: Normal range of motion. She exhibits tenderness.  Left paraspinal muscles tender to palpation  Neurological: She is alert and oriented to person, place, and time.  Skin: Skin is dry.  Psychiatric: She has a normal mood and affect. Her behavior is normal. Judgment and thought content normal.    ED Course  Procedures (including critical care time)  Labs Reviewed - No data to display No results found.   1. Back strain       MDM  56 rolled female with back pain. I suspect that this is likely musculoskeletal, as the patient reportedly had a clean urinalysis from her primary care provider, she denies any urinary symptoms, the pain is reproducible with palpation, and worse with movement. I will treat the patient with Toradol in the emergency department, and will discharge the  patient with some pain pills, Naprosyn, and Robaxin. Patient understands and agrees with the plan. She is stable and ready for discharge.        Roxy Horseman, PA-C 09/18/12 [phone]

## 2012-09-18 NOTE — ED Provider Notes (Signed)
Medical screening examination/treatment/procedure(s) were performed by non-physician practitioner and as supervising physician I was immediately available for consultation/collaboration.  Elia Nunley M Dallis Czaja, MD 09/18/12 0747 

## 2012-10-26 ENCOUNTER — Inpatient Hospital Stay (HOSPITAL_COMMUNITY)
Admission: EM | Admit: 2012-10-26 | Discharge: 2012-10-29 | DRG: 294 | Disposition: A | Discharge status: Home / Self Care | Payer: BC Managed Care – PPO | Attending: Internal Medicine | Admitting: Internal Medicine

## 2012-10-26 ENCOUNTER — Encounter (HOSPITAL_COMMUNITY): Payer: Self-pay | Admitting: *Deleted

## 2012-10-26 DIAGNOSIS — Z7982 Long term (current) use of aspirin: Secondary | ICD-10-CM

## 2012-10-26 DIAGNOSIS — Z9089 Acquired absence of other organs: Secondary | ICD-10-CM

## 2012-10-26 DIAGNOSIS — B9789 Other viral agents as the cause of diseases classified elsewhere: Secondary | ICD-10-CM | POA: Diagnosis present

## 2012-10-26 DIAGNOSIS — E1110 Type 2 diabetes mellitus with ketoacidosis without coma: Secondary | ICD-10-CM

## 2012-10-26 DIAGNOSIS — R112 Nausea with vomiting, unspecified: Secondary | ICD-10-CM | POA: Diagnosis present

## 2012-10-26 DIAGNOSIS — E1010 Type 1 diabetes mellitus with ketoacidosis without coma: Principal | ICD-10-CM | POA: Diagnosis present

## 2012-10-26 DIAGNOSIS — Z794 Long term (current) use of insulin: Secondary | ICD-10-CM

## 2012-10-26 DIAGNOSIS — I10 Essential (primary) hypertension: Secondary | ICD-10-CM | POA: Diagnosis present

## 2012-10-26 DIAGNOSIS — Z79899 Other long term (current) drug therapy: Secondary | ICD-10-CM

## 2012-10-26 DIAGNOSIS — R197 Diarrhea, unspecified: Secondary | ICD-10-CM | POA: Diagnosis present

## 2012-10-26 DIAGNOSIS — E109 Type 1 diabetes mellitus without complications: Secondary | ICD-10-CM | POA: Diagnosis present

## 2012-10-26 LAB — CBC
HCT: 40 % (ref 36.0–46.0)
HCT: 35.4 % — ABNORMAL LOW (ref 36.0–46.0)
Hemoglobin: 14 g/dL (ref 12.0–15.0)
Hemoglobin: 12.7 g/dL (ref 12.0–15.0)
MCV: 87 fL (ref 78.0–100.0)
RBC: 4.6 MIL/uL (ref 3.87–5.11)
WBC: 12.2 10*3/uL — ABNORMAL HIGH (ref 4.0–10.5)
WBC: 12.8 10*3/uL — ABNORMAL HIGH (ref 4.0–10.5)

## 2012-10-26 LAB — URINALYSIS, ROUTINE W REFLEX MICROSCOPIC
Bilirubin Urine: NEGATIVE
Ketones, ur: >80 mg/dL — AB
Leukocytes, UA: NEGATIVE
Nitrite: NEGATIVE
Protein, ur: NEGATIVE mg/dL
pH: 5 (ref 5.0–8.0)

## 2012-10-26 LAB — LIPASE, BLOOD: Lipase: 11 U/L (ref 11–59)

## 2012-10-26 LAB — URINE MICROSCOPIC-ADD ON

## 2012-10-26 LAB — COMPREHENSIVE METABOLIC PANEL
Alkaline Phosphatase: 139 U/L — ABNORMAL HIGH (ref 39–117)
BUN: 21 mg/dL (ref 6–23)
CO2: 12 mEq/L — ABNORMAL LOW (ref 19–32)
Chloride: 96 mEq/L (ref 96–112)
Creatinine, Ser: 0.81 mg/dL (ref 0.50–1.10)
GFR calc non Af Amer: 80 mL/min — ABNORMAL LOW (ref >90)
Glucose, Bld: 556 mg/dL (ref 70–99)
Potassium: 5.2 mEq/L — ABNORMAL HIGH (ref 3.5–5.1)
Total Bilirubin: 0.6 mg/dL (ref 0.3–1.2)

## 2012-10-26 LAB — KETONES, QUALITATIVE

## 2012-10-26 MED ORDER — SODIUM CHLORIDE 0.9 % IV SOLN
INTRAVENOUS | Status: DC
  Administered 2012-10-26: 19:00:00 via INTRAVENOUS

## 2012-10-26 MED ORDER — DEXTROSE-NACL 5-0.45 % IV SOLN
INTRAVENOUS | Status: DC
  Administered 2012-10-26: 22:00:00 via INTRAVENOUS

## 2012-10-26 MED ORDER — SODIUM CHLORIDE 0.9 % IV SOLN
INTRAVENOUS | Status: AC
  Administered 2012-10-26: 5 [IU]/h via INTRAVENOUS
  Filled 2012-10-26: qty 1

## 2012-10-26 MED ORDER — SODIUM CHLORIDE 0.9 % IV SOLN
INTRAVENOUS | Status: DC
  Filled 2012-10-26: qty 1

## 2012-10-26 MED ORDER — HEPARIN SODIUM (PORCINE) 5000 UNIT/ML IJ SOLN
5000.0000 [IU] | Freq: Three times a day (TID) | INTRAMUSCULAR | Status: DC
  Administered 2012-10-26 – 2012-10-29 (×7): 5000 [IU] via SUBCUTANEOUS
  Filled 2012-10-26 (×12): qty 1

## 2012-10-26 MED ORDER — SODIUM CHLORIDE 0.9 % IV SOLN
INTRAVENOUS | Status: DC
  Administered 2012-10-26: 21:00:00 via INTRAVENOUS

## 2012-10-26 MED ORDER — SODIUM CHLORIDE 0.9 % IV BOLUS (SEPSIS)
1000.0000 mL | Freq: Once | INTRAVENOUS | Status: AC
  Administered 2012-10-26: 1000 mL via INTRAVENOUS

## 2012-10-26 MED ORDER — ONDANSETRON HCL 4 MG/2ML IJ SOLN
4.0000 mg | Freq: Four times a day (QID) | INTRAMUSCULAR | Status: DC | PRN
  Administered 2012-10-26 – 2012-10-28 (×3): 4 mg via INTRAVENOUS
  Filled 2012-10-26 (×3): qty 2

## 2012-10-26 MED ORDER — SODIUM CHLORIDE 0.9 % IV SOLN: INTRAVENOUS | Status: DC

## 2012-10-26 MED ORDER — ASPIRIN EC 81 MG PO TBEC
81.0000 mg | DELAYED_RELEASE_TABLET | Freq: Every evening | ORAL | Status: DC
  Administered 2012-10-26 – 2012-10-28 (×3): 81 mg via ORAL
  Filled 2012-10-26 (×4): qty 1

## 2012-10-26 MED ORDER — INSULIN REGULAR BOLUS VIA INFUSION
0.0000 [IU] | Freq: Three times a day (TID) | INTRAVENOUS | Status: DC
  Filled 2012-10-26: qty 10

## 2012-10-26 MED ORDER — ONDANSETRON HCL 4 MG/2ML IJ SOLN
4.0000 mg | Freq: Once | INTRAMUSCULAR | Status: AC
  Administered 2012-10-26: 4 mg via INTRAVENOUS
  Filled 2012-10-26: qty 2

## 2012-10-26 MED ORDER — SODIUM CHLORIDE 0.9 % IV SOLN
INTRAVENOUS | Status: DC
  Administered 2012-10-26 – 2012-10-27 (×3): via INTRAVENOUS

## 2012-10-26 MED ORDER — DEXTROSE 50 % IV SOLN: 25.0000 mL | INTRAVENOUS | Status: DC | PRN

## 2012-10-26 MED ORDER — INSULIN ASPART 100 UNIT/ML ~~LOC~~ SOLN
10.0000 [IU] | Freq: Once | SUBCUTANEOUS | Status: AC
  Administered 2012-10-26: 10 [IU] via SUBCUTANEOUS
  Filled 2012-10-26: qty 1

## 2012-10-26 NOTE — H&P (Signed)
[facility] History and Physical  Helen [name] ZOX:[id number] DOB: 1956/05/21 DOA: 10/26/2012  Referring physician: Dr. Karma Ganja PCP: Default, Provider, MD  Specialists: none  Chief Complaint: nausea/vomiting  HPI: Helen [name] is a 57 y.o. female has a past medical history significant for type 1 diabetes followed by Dr. Sharl Ma, poorly controlled, with her most hemoglobin A1c of 9 (per patient), on Lantus daily and sliding scale, presents with a chief complaint of nausea vomiting and feeling poorly for 24 hours. She knows that her diabetes is poorly controlled, and she had an appointment tomorrow morning to be set up with an insulin pump. Couple days ago, patient started having abdominal discomfort, with nausea and diarrhea, and inability to have any by mouth intake. Given the fact that she was unable to eat, she has not used any insulin in the past 2 days. She denies any frank fevers, however endorses feeling cold. She denies any burning with urination. She has no chest pain, cough, or sinus pressure. She has no symptoms of an upper respiratory infection. She reports that she has had DKA once in the past. In the emergency room, she was found to be acidotic with a bicarbonate of 12, glucose in the mid 500s, and anion gap. She endorses sick contacts, with a friend having a GI bug about a week ago.  Review of Systems: As per history of present illness, otherwise negative  Past Medical History  Diagnosis Date  . Shingles   . Diabetes mellitus without complication    Past Surgical History  Procedure Laterality Date  . Cholecystectomy     Social History:  reports that she has never smoked. She does not have any smokeless tobacco history on file. She reports that she does not drink alcohol or use illicit drugs.  No Known Allergies  History reviewed. No pertinent family history.    Prior to Admission medications   Medication Sig Start Date End Date Taking? Authorizing Provider   aspirin EC 81 MG tablet Take 81 mg by mouth every evening.   Yes Historical Provider, MD  cholecalciferol (VITAMIN D) 1000 UNITS tablet Take 1,000 Units by mouth every morning.   Yes Historical Provider, MD  LANTUS SOLOSTAR 100 UNIT/ML injection Inject 30 Units into the skin at bedtime.  07/08/12  Yes Historical Provider, MD  lisinopril (PRINIVIL,ZESTRIL) 5 MG tablet Take 5 mg by mouth every evening. 06/16/12  Yes Historical Provider, MD  NOVOLOG FLEXPEN 100 UNIT/ML injection Inject 5-25 Units into the skin 3 (three) times daily with meals. According to sliding scale 06/18/12  Yes Historical Provider, MD   Physical Exam: Filed Vitals:   10/26/12 1500  BP: 111/57  Pulse: 113  Temp: 98 F (36.7 C)  TempSrc: Oral  Resp: 28  SpO2: 96%     General:  NAD  Eyes: PERRL, EOMI  ENT: moist oropharynx  Neck: supple, no JVD  Cardiovascular: RRR without MRG   Respiratory: CTA biL, good air movement without wheezing, rhonchi or crackled  Abdomen: soft, non tender to palpation, positive bowel sounds, no guarding, no rebound  Skin: no rashes  Musculoskeletal: no peripheral edema  Psychiatric: normal mood and affect  Neurologic: CN 2-12 grossly intact, MS 5/5 in all 4  Labs on Admission:  Basic Metabolic Panel:  Recent Labs Lab 10/26/12 1631  NA 136  K 5.2*  CL 96  CO2 12*  GLUCOSE 556*  BUN 21  CREATININE 0.81  CALCIUM 9.6   Liver Function Tests:  Recent Labs Lab  10/26/12 1631  AST 19  ALT 25  ALKPHOS 139*  BILITOT 0.6  PROT 8.6*  ALBUMIN 3.9    Recent Labs Lab 10/26/12 1631  LIPASE 11   CBC:  Recent Labs Lab 10/26/12 1631  WBC 12.2*  HGB 14.0  HCT 40.0  MCV 87.0  PLT 306   CBG:  Recent Labs Lab 10/26/12 1541  GLUCAP 481*    EKG: Independently reviewed.  Assessment/Plan Active Problems:   DKA (diabetic ketoacidoses)   Diabetes mellitus type I   DKA - likely precipitated by the gastrointestinal illness and her being off insulin for  48 hours. - Anion gap of 28 - Will monitor BMP every 4 hours - Potassium 5.2, no repletion necessary at this point - Received 2 L of bolus of normal saline in the ED we'll continue fluid maintenance - She is on insulin drip - We'll admit to step down - N.p.o. - No evidence of infection, she is not febrile, urinalysis is clear.  Type 1 diabetes - Poorly controlled based on the report that hemoglobin A1c. - I explained to the patient that she is a type I and she cannot go without any insulin whatsoever. She expressed understanding. - patient denies complications associated with diabetes, is followed by an ophthalmologist once a year, and denies any neuropathy.  Hypertension - patient is on lisinopril, however she denies having hypertension. - This is probably for renal protection. - She is not hypertensive here and would not start this now for  Code Status: Full  Family Communication: none  Disposition Plan: stepdown  Time spent: 73  Leandre Wien M. Elvera Lennox, MD [facility] Pager [phone]  If 7PM-7AM, please contact night-coverage [email or web address] Password [facility] 10/26/2012, 6:33 PM

## 2012-10-26 NOTE — ED Provider Notes (Signed)
History     CSN: [id number]  Arrival date & time 10/26/12  1456   First MD Initiated Contact with Patient 10/26/12 1505      Chief Complaint  Patient presents with  . Emesis  . Shortness of Breath  . Weakness    (Consider location/radiation/quality/duration/timing/severity/associated sxs/prior treatment) HPI Pt presenting with c/o vomiting, subjective fever and generalized weakness. She started having vomiting yesterday- has had mutliple episodes, not able to keep down any liquids or food.  Diffuse abdominal pain.  Denies dysuria.  No fainting.  Has hx of diabetes and has not taken her insulin since yesterday due to vomiting.  Nothing makes symptoms better or worse.  There are no other associated systemic symptoms, there are no other alleviating or modifying factors.   Past Medical History  Diagnosis Date  . Shingles   . Diabetes mellitus without complication     Past Surgical History  Procedure Laterality Date  . Cholecystectomy      History reviewed. No pertinent family history.  History  Substance Use Topics  . Smoking status: Never Smoker   . Smokeless tobacco: Not on file  . Alcohol Use: No    OB History   Grav Para Term Preterm Abortions TAB SAB Ect Mult Living                  Review of Systems ROS reviewed and all otherwise negative except for mentioned in HPI  Allergies  Review of patient's allergies indicates no known allergies.  Home Medications   No current outpatient prescriptions on file.  BP 133/54  Pulse 96  Temp(Src) 98.5 F (36.9 C) (Oral)  Resp 16  Ht 5\' 5"  (1.651 m)  Wt 182 lb 1.6 oz (82.6 kg)  BMI 30.3 kg/m2  SpO2 96% Vitals reviewed Physical Exam Physical Examination: General appearance - alert, ill appearing, and in mild distress Mental status - alert, oriented to person, place, and time Eyes  No conjunctival injection, no scleral icterus Mouth - mucous membranes moist, pharynx normal without lesions Chest - clear to  auscultation, no wheezes, rales or rhonchi, symmetric air entry Heart - tachycardic rate, regular rhythm, normal S1, S2, no murmurs, rubs, clicks or gallops Abdomen - soft, nontender, nondistended, no masses or organomegaly Extremities - peripheral pulses normal, no pedal edema, no clubbing or cyanosis Skin - normal coloration and turgor, no rashes  ED Course  Procedures (including critical care time)  6:08 PM d/w Dr. Wyonia Hough, pt to be admitted to step down, team 4, temporary admission orders written   Date: 10/26/2012  Rate: 96  Rhythm: normal sinus rhythm  QRS Axis: normal  Intervals: normal  ST/T Wave abnormalities: nonspecific T wave changes  Conduction Disutrbances:none  Narrative Interpretation:   Old EKG Reviewed: none available  CRITICAL CARE Performed by: Ethelda Chick   Total critical care time: 40  Critical care time was exclusive of separately billable procedures and treating other patients.  Critical care was necessary to treat or prevent imminent or life-threatening deterioration.  Critical care was time spent personally by me on the following activities: development of treatment plan with patient and/or surrogate as well as nursing, discussions with consultants, evaluation of patient's response to treatment, examination of patient, obtaining history from patient or surrogate, ordering and performing treatments and interventions, ordering and review of laboratory studies, ordering and review of radiographic studies, pulse oximetry and re-evaluation of patient's condition.  Labs Reviewed  CBC - Abnormal; Notable for the following:    WBC  12.2 (*)    All other components within normal limits  COMPREHENSIVE METABOLIC PANEL - Abnormal; Notable for the following:    Potassium 5.2 (*)    CO2 12 (*)    Glucose, Bld 556 (*)    Total Protein 8.6 (*)    Alkaline Phosphatase 139 (*)    GFR calc non Af Amer 80 (*)    All other components within normal limits   URINALYSIS, ROUTINE W REFLEX MICROSCOPIC - Abnormal; Notable for the following:    Glucose, UA >1000 (*)    Ketones, ur >80 (*)    All other components within normal limits  GLUCOSE, CAPILLARY - Abnormal; Notable for the following:    Glucose-Capillary 481 (*)    All other components within normal limits  KETONES, QUALITATIVE - Abnormal; Notable for the following:    Acetone, Bld SMALL (*)    All other components within normal limits  CBC - Abnormal; Notable for the following:    WBC 12.8 (*)    HCT 35.4 (*)    All other components within normal limits  BASIC METABOLIC PANEL - Abnormal; Notable for the following:    Glucose, Bld 145 (*)    All other components within normal limits  BASIC METABOLIC PANEL - Abnormal; Notable for the following:    Glucose, Bld 200 (*)    All other components within normal limits  BASIC METABOLIC PANEL - Abnormal; Notable for the following:    CO2 18 (*)    Glucose, Bld 150 (*)    All other components within normal limits  GLUCOSE, CAPILLARY - Abnormal; Notable for the following:    Glucose-Capillary 261 (*)    All other components within normal limits  GLUCOSE, CAPILLARY - Abnormal; Notable for the following:    Glucose-Capillary 224 (*)    All other components within normal limits  GLUCOSE, CAPILLARY - Abnormal; Notable for the following:    Glucose-Capillary 157 (*)    All other components within normal limits  GLUCOSE, CAPILLARY - Abnormal; Notable for the following:    Glucose-Capillary 141 (*)    All other components within normal limits  GLUCOSE, CAPILLARY - Abnormal; Notable for the following:    Glucose-Capillary 144 (*)    All other components within normal limits  GLUCOSE, CAPILLARY - Abnormal; Notable for the following:    Glucose-Capillary 129 (*)    All other components within normal limits  GLUCOSE, CAPILLARY - Abnormal; Notable for the following:    Glucose-Capillary 151 (*)    All other components within normal limits   GLUCOSE, CAPILLARY - Abnormal; Notable for the following:    Glucose-Capillary 174 (*)    All other components within normal limits  GLUCOSE, CAPILLARY - Abnormal; Notable for the following:    Glucose-Capillary 173 (*)    All other components within normal limits  GLUCOSE, CAPILLARY - Abnormal; Notable for the following:    Glucose-Capillary 179 (*)    All other components within normal limits  HEMOGLOBIN A1C - Abnormal; Notable for the following:    Hemoglobin A1C 10.9 (*)    Mean Plasma Glucose 266 (*)    All other components within normal limits  GLUCOSE, CAPILLARY - Abnormal; Notable for the following:    Glucose-Capillary 179 (*)    All other components within normal limits  GLUCOSE, CAPILLARY - Abnormal; Notable for the following:    Glucose-Capillary 180 (*)    All other components within normal limits  GLUCOSE, CAPILLARY - Abnormal; Notable for  the following:    Glucose-Capillary 311 (*)    All other components within normal limits  GLUCOSE, CAPILLARY - Abnormal; Notable for the following:    Glucose-Capillary 318 (*)    All other components within normal limits  GLUCOSE, CAPILLARY - Abnormal; Notable for the following:    Glucose-Capillary 289 (*)    All other components within normal limits  MRSA PCR SCREENING  LIPASE, BLOOD  URINE MICROSCOPIC-ADD ON  BASIC METABOLIC PANEL   No results found.   1. DKA (diabetic ketoacidoses)   2. Diabetes mellitus type I       MDM  Pt presenting with acute onset of nausea and vomiting.  Pt found to bey hyperglycemic and in DKA in ED.  Insulin drip started, pt treated also with IV hydration and antiemetics.  Rechecked multiple times and feeling improved.  Potassium mildly elevated, EKG reassuring.  Pt admitted to stepdown bed.  She is agreeable with this plan.        Ethelda Chick, MD 10/27/12 [phone]

## 2012-10-26 NOTE — ED Notes (Signed)
To ED for eval of vomiting, fever, weakness since yesterday. Pt appears very pale and weak.

## 2012-10-27 ENCOUNTER — Encounter (HOSPITAL_COMMUNITY): Payer: Self-pay

## 2012-10-27 DIAGNOSIS — R112 Nausea with vomiting, unspecified: Secondary | ICD-10-CM | POA: Diagnosis present

## 2012-10-27 LAB — BASIC METABOLIC PANEL
BUN: 17 mg/dL (ref 6–23)
BUN: 19 mg/dL (ref 6–23)
BUN: 17 mg/dL (ref 6–23)
CO2: 19 mEq/L (ref 19–32)
CO2: 22 mEq/L (ref 19–32)
Calcium: 8.7 mg/dL (ref 8.4–10.5)
Chloride: 108 mEq/L (ref 96–112)
Chloride: 106 mEq/L (ref 96–112)
Chloride: 104 mEq/L (ref 96–112)
Creatinine, Ser: 0.7 mg/dL (ref 0.50–1.10)
Creatinine, Ser: 0.75 mg/dL (ref 0.50–1.10)
GFR calc Af Amer: >90 mL/min (ref >90)
GFR calc Af Amer: >90 mL/min (ref >90)
GFR calc Af Amer: >90 mL/min (ref >90)
GFR calc Af Amer: >90 mL/min (ref >90)
GFR calc non Af Amer: >90 mL/min (ref >90)
GFR calc non Af Amer: >90 mL/min (ref >90)
Glucose, Bld: 145 mg/dL — ABNORMAL HIGH (ref 70–99)
Potassium: 4.1 mEq/L (ref 3.5–5.1)
Potassium: 4.1 mEq/L (ref 3.5–5.1)
Potassium: 4.4 mEq/L (ref 3.5–5.1)
Potassium: 4.2 mEq/L (ref 3.5–5.1)
Sodium: 140 mEq/L (ref 135–145)
Sodium: 135 mEq/L (ref 135–145)

## 2012-10-27 LAB — HEMOGLOBIN A1C: Mean Plasma Glucose: 266 mg/dL — ABNORMAL HIGH (ref <117)

## 2012-10-27 LAB — GLUCOSE, CAPILLARY
Glucose-Capillary: 105 mg/dL — ABNORMAL HIGH (ref 70–99)
Glucose-Capillary: 144 mg/dL — ABNORMAL HIGH (ref 70–99)
Glucose-Capillary: 146 mg/dL — ABNORMAL HIGH (ref 70–99)
Glucose-Capillary: 151 mg/dL — ABNORMAL HIGH (ref 70–99)
Glucose-Capillary: 179 mg/dL — ABNORMAL HIGH (ref 70–99)
Glucose-Capillary: 224 mg/dL — ABNORMAL HIGH (ref 70–99)
Glucose-Capillary: 261 mg/dL — ABNORMAL HIGH (ref 70–99)
Glucose-Capillary: 289 mg/dL — ABNORMAL HIGH (ref 70–99)
Glucose-Capillary: 318 mg/dL — ABNORMAL HIGH (ref 70–99)

## 2012-10-27 LAB — MRSA PCR SCREENING: MRSA by PCR: NEGATIVE

## 2012-10-27 MED ORDER — INSULIN ASPART 100 UNIT/ML ~~LOC~~ SOLN: 0.0000 [IU] | Freq: Every day | SUBCUTANEOUS | Status: DC

## 2012-10-27 MED ORDER — INSULIN GLARGINE 100 UNIT/ML ~~LOC~~ SOLN
15.0000 [IU] | Freq: Once | SUBCUTANEOUS | Status: DC
  Filled 2012-10-27: qty 0.15

## 2012-10-27 MED ORDER — INSULIN ASPART 100 UNIT/ML ~~LOC~~ SOLN
0.0000 [IU] | Freq: Three times a day (TID) | SUBCUTANEOUS | Status: DC
  Administered 2012-10-28: 11 [IU] via SUBCUTANEOUS
  Administered 2012-10-28: 3 [IU] via SUBCUTANEOUS

## 2012-10-27 MED ORDER — INSULIN GLARGINE 100 UNIT/ML ~~LOC~~ SOLN: 30.0000 [IU] | Freq: Every day | SUBCUTANEOUS | Status: DC

## 2012-10-27 MED ORDER — INSULIN GLARGINE 100 UNIT/ML ~~LOC~~ SOLN
15.0000 [IU] | Freq: Once | SUBCUTANEOUS | Status: AC
  Administered 2012-10-27: 15 [IU] via SUBCUTANEOUS
  Filled 2012-10-27: qty 0.15

## 2012-10-27 MED ORDER — SODIUM CHLORIDE 0.9 % IV SOLN
INTRAVENOUS | Status: DC
  Administered 2012-10-27 – 2012-10-29 (×3): via INTRAVENOUS

## 2012-10-27 MED ORDER — INSULIN ASPART 100 UNIT/ML ~~LOC~~ SOLN
12.0000 [IU] | Freq: Three times a day (TID) | SUBCUTANEOUS | Status: DC
  Administered 2012-10-27: 12 [IU] via SUBCUTANEOUS

## 2012-10-27 MED ORDER — INSULIN GLARGINE 100 UNIT/ML ~~LOC~~ SOLN
30.0000 [IU] | Freq: Every day | SUBCUTANEOUS | Status: DC
  Administered 2012-10-28: 30 [IU] via SUBCUTANEOUS
  Filled 2012-10-27: qty 0.3

## 2012-10-27 MED ORDER — INSULIN ASPART 100 UNIT/ML ~~LOC~~ SOLN: 0.0000 [IU] | Freq: Three times a day (TID) | SUBCUTANEOUS | Status: DC

## 2012-10-27 NOTE — Progress Notes (Signed)
Current order states to check BMET every four hours, has not had one in the last four hours. Per work list BMET scheduled for 4/2 at 0215 and 0615. Called lab to clarify. Stated they could not see order. Notified Maren Reamer. Received order for STAT BMET. Will continue monitor patient.   Rochele Pages, RN

## 2012-10-27 NOTE — Progress Notes (Signed)
[facility] Progress Note [place] TEAM 1 - Stepdown/ICU TEAM   Helen [name] UJW:[id number] DOB: 25-Jun-1956 DOA: 10/26/2012 PCP: Default, Provider, MD  Brief narrative/obtained from HPI: Helen [name] is a 57 y.o. female has a past medical history significant for type 1 diabetes followed by Dr. Sharl Ma, poorly controlled, with her most hemoglobin A1c of 9 (per patient), on Lantus daily and sliding scale, presents with a chief complaint of nausea vomiting and feeling poorly for 24 hours. She knows that her diabetes is poorly controlled, and she had an appointment tomorrow morning to be set up with an insulin pump. Couple days ago, patient started having abdominal discomfort, with nausea and diarrhea, and inability to have any by mouth intake. Given the fact that she was unable to eat, she has not used any insulin in the past 2 days. She denies any frank fevers, however endorses feeling cold. She denies any burning with urination. She has no chest pain, cough, or sinus pressure. She has no symptoms of an upper respiratory infection. She reports that she has had DKA once in the past. In the emergency room, she was found to be acidotic with a bicarbonate of 12, glucose in the mid 500s, and anion gap. She endorses sick contacts, with a friend having a GI bug about a week ago.   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 No other pump Explains that she was scheduled for an insulin pump evaluation today.   Objective: Blood pressure 132/63, pulse 96, temperature 98.5 F (36.9 C), temperature source Oral, resp. rate 16, height 5\' 5"  (1.651 m), weight 82.6 kg (182 lb 1.6 oz), SpO2 96.00%.  Intake/Output Summary (Last 24 hours) at 10/27/12 1729 Last data filed at 10/27/12 0700  Gross per 24 hour  Intake  642.4 ml  Output    250 ml  Net  392.4 ml     Exam: General: No acute respiratory distress Lungs: Clear to auscultation bilaterally without wheezes or crackles Cardiovascular: Regular rate and rhythm without murmur gallop or rub normal S1 and S2 Abdomen: Nontender, nondistended, soft, bowel sounds positive, no rebound, no ascites, no appreciable mass Extremities: No significant cyanosis, clubbing, or edema bilateral lower extremities  Data Reviewed: Basic Metabolic Panel:  Recent Labs Lab 10/26/12 1631 10/26/12 2315 10/27/12 0203 10/27/12 0620  NA 136 140 140 140  K 5.2* 4.1 4.1 4.1  CL 96 107 108 106  CO2 12* 18* 19 19  GLUCOSE 556* 150* 145* 200*  BUN 21 17 17 19   CREATININE 0.81 0.72 0.70 0.70  CALCIUM 9.6 8.8 8.7 8.6   Liver Function Tests:  Recent Labs  Lab 10/26/12 1631  AST 19  ALT 25  ALKPHOS 139*  BILITOT 0.6  PROT 8.6*  ALBUMIN 3.9    Recent Labs Lab 10/26/12 1631  LIPASE 11   No results found for this basename: AMMONIA,  in the last 168 hours CBC:  Recent Labs Lab 10/26/12 1631 10/26/12 1925  WBC 12.2* 12.8*  HGB 14.0 12.7  HCT 40.0 35.4*  MCV 87.0 85.1  PLT 306 339   Cardiac Enzymes: No results found for this basename: CKTOTAL, CKMB, CKMBINDEX,  TROPONINI,  in the last 168 hours BNP (last 3 results) No results found for this basename: PROBNP,  in the last 8760 hours CBG:  Recent Labs Lab 10/27/12 1254 10/27/12 1408 10/27/12 1522 10/27/12 1617 10/27/12 1720  GLUCAP 318* 289* 191* 146* 105*    Recent Results (from the past 240 hour(s))  MRSA PCR SCREENING     Status: None   Collection Time    10/27/12 12:51 AM      Result Value Range Status   MRSA by PCR NEGATIVE  NEGATIVE Final   Comment:            The GeneXpert MRSA Assay (FDA     approved for NASAL specimens     only), is one component of a     comprehensive MRSA colonization     surveillance program. It is not     intended to diagnose MRSA     infection nor to guide or     monitor treatment for     MRSA infections.     Studies:  Recent x-ray studies have been reviewed in detail by the Attending Physician  Scheduled Meds:  Scheduled Meds: . aspirin EC  81 mg Oral QPM  . heparin  5,000 Units Subcutaneous Q8H  . insulin aspart  0-15 Units Subcutaneous TID WC  . insulin aspart  0-5 Units Subcutaneous QHS  . insulin glargine  15 Units Subcutaneous Once  . [START ON 10/28/2012] insulin glargine  30 Units Subcutaneous QHS   Continuous Infusions: . sodium chloride 125 mL/hr at 10/27/12 1424  . sodium chloride 100 mL/hr at 10/27/12 1424    Time spent on care of this patient: 35 minutes   [facility]  [facility] Office  [phone] Pager - Text Page per Loretha Stapler as per below:  On-Call/Text Page:      [email or web address]      password [email or web address]  If 7PM-7AM, please contact night-coverage [email or web address] Password [email or web address] 10/27/2012, 5:29 PM   LOS: 1 day

## 2012-10-27 NOTE — Progress Notes (Signed)
Utilization review completed.  

## 2012-10-27 NOTE — Progress Notes (Signed)
Inpatient Diabetes Program Recommendations  AACE/ADA: New Consensus Statement on Inpatient Glycemic Control (2013)  Target Ranges:  Prepandial:   less than 140 mg/dL      Peak postprandial:   less than 180 mg/dL (1-2 hours)      Critically ill patients:  140 - 180 mg/dL  Noted patient has been placed back on insulin drip after discontinuation earlier.  Lantus must be given 1-preferably 2 hrs before the drip is discontinued. Would suggest using 30 units lantus at that time, and start correction insulin at the same hour the drip is discontinued-would suggest using the sensitive scale tidwc plus the HS scale. Then the patient will need meal coverage as well. With our 60 gram carb modified meals, pt would need 4-6 units meal coverage depending on how much of her tray she eats. Correction alone will not cover her meals unless she has a high correction scale. Pt needs re-enforcement of sick day rules including frequent monitoring of glucose and using a correction scale q 4 hrs (not Sliding Scale, as this includes coverage of meals) and to always give her basal insulin whether or not she is eating.  Will add this to exit care notes.Will also have RN's assist pt with watching the videos on sick day rules. Acidosis appears to be clearing.  Please continue b-mets q 6-8 hrs after drip is discontinued.    Thank you, Lenor Coffin, RN, CNS, Diabetes Coordinator [phone])

## 2012-10-27 NOTE — Progress Notes (Signed)
Spoke with MD after CBG reached 105, anion gap 13, stopped insulin gtt, order change with meal coverage and lantus. Will continue to monitor for changes.

## 2012-10-28 LAB — GLUCOSE, CAPILLARY
Glucose-Capillary: 161 mg/dL — ABNORMAL HIGH (ref 70–99)
Glucose-Capillary: 267 mg/dL — ABNORMAL HIGH (ref 70–99)
Glucose-Capillary: 65 mg/dL — ABNORMAL LOW (ref 70–99)
Glucose-Capillary: 105 mg/dL — ABNORMAL HIGH (ref 70–99)
Glucose-Capillary: 139 mg/dL — ABNORMAL HIGH (ref 70–99)
Glucose-Capillary: 152 mg/dL — ABNORMAL HIGH (ref 70–99)

## 2012-10-28 LAB — BASIC METABOLIC PANEL
CO2: 17 mEq/L — ABNORMAL LOW (ref 19–32)
CO2: 22 mEq/L (ref 19–32)
Calcium: 8.3 mg/dL — ABNORMAL LOW (ref 8.4–10.5)
Chloride: 102 mEq/L (ref 96–112)
Chloride: 104 mEq/L (ref 96–112)
Creatinine, Ser: 0.62 mg/dL (ref 0.50–1.10)
Glucose, Bld: 98 mg/dL (ref 70–99)
Potassium: 4.6 mEq/L (ref 3.5–5.1)
Potassium: 3.5 mEq/L (ref 3.5–5.1)
Sodium: 134 mEq/L — ABNORMAL LOW (ref 135–145)

## 2012-10-28 MED ORDER — PROMETHAZINE HCL 25 MG/ML IJ SOLN
12.5000 mg | Freq: Four times a day (QID) | INTRAMUSCULAR | Status: DC | PRN
  Administered 2012-10-28: 12.5 mg via INTRAVENOUS
  Filled 2012-10-28 (×2): qty 1

## 2012-10-28 MED ORDER — INSULIN ASPART 100 UNIT/ML ~~LOC~~ SOLN: 0.0000 [IU] | Freq: Three times a day (TID) | SUBCUTANEOUS | Status: DC

## 2012-10-28 MED ORDER — INSULIN GLARGINE 100 UNIT/ML ~~LOC~~ SOLN
24.0000 [IU] | Freq: Every day | SUBCUTANEOUS | Status: DC
  Administered 2012-10-29: 24 [IU] via SUBCUTANEOUS
  Filled 2012-10-28: qty 0.24

## 2012-10-28 NOTE — Progress Notes (Signed)
[facility] Progress Note  TEAM 1 - Stepdown/ICU TEAM   OLA RAAP ZOX:[id number] DOB: Jul 20, 1956 DOA: 10/26/2012 PCP: Default, Provider, MD  Brief narrative/obtained from HPI: Helen [name] is a 57 y.o. female has a past medical history significant for type 1 diabetes followed by Dr. Sharl Ma, poorly controlled, with her most hemoglobin A1c of 9 (per patient), on Lantus daily and sliding scale, presents with a chief complaint of nausea vomiting and feeling poorly for 24 hours. She knows that her diabetes is poorly controlled, and she had an appointment tomorrow morning to be set up with an insulin pump. Couple days ago, patient started having abdominal discomfort, with nausea and diarrhea, and inability to have any by mouth intake. Given the fact that she was unable to eat, she has not used any insulin in the past 2 days. She denies any frank fevers, however endorses feeling cold. She denies any burning with urination. She has no chest pain, cough, or sinus pressure. She has no symptoms of an upper respiratory infection. She reports that she has had DKA once in the past. In the emergency room, she was found to be acidotic with a bicarbonate of 12, glucose in the mid 500s, and anion gap. She endorses sick contacts, with a friend having a GI bug about a week ago.   Assessment/Plan: Active Problems:   DKA (diabetic ketoacidoses) -anion gap resolved - pt becoming hypoglycemic today as she is not eating as well as she was expecting to - will decrease Lantus dose for tomorrow and will decrease to sensitive sliding scale -pt count carbs and usually administers 15 u of Novolog for a small-mod amount of carbs- for now I have ordered 12 U on Novolog if she is to eat a complete meal.     Diabetes mellitus type I - based on A1c, this is uncontrolled -Patient was scheduled yesterday for an insulin pump  Nausea and vomiting -improved significantly but still not resolved completely  -  possibly Noro-virus as there is a community outbreak at this time    Code Status: Full code Family Communication: None Disposition Plan: transfer to med/surg  Consultants: None  Procedures: None  Antibiotics: None  DVT prophylaxis: Heparin  HPI/Subjective: Patient not eating much today due to loss of appetite- had some severe nausea this AM   Objective: Blood pressure 129/63, pulse 96, temperature 99.2 F (37.3 C), temperature source Oral, resp. rate 20, height 5\' 5"  (1.651 m), weight 82.6 kg (182 lb 1.6 oz), SpO2 95.00%.  Intake/Output Summary (Last 24 hours) at 10/28/12 1912 Last data filed at 10/28/12 1212  Gross per 24 hour  Intake      0 ml  Output      0 ml  Net      0 ml     Exam: General: No acute respiratory distress Lungs: Clear to auscultation bilaterally without wheezes or crackles Cardiovascular: Regular rate and rhythm without murmur gallop or rub normal S1 and S2 Abdomen: Nontender, nondistended, soft, bowel sounds positive, no rebound, no ascites, no appreciable mass Extremities: No significant cyanosis, clubbing, or edema bilateral lower extremities  Data Reviewed: Basic Metabolic Panel:  Recent Labs Lab 10/27/12 0620 10/27/12 1648 10/27/12 2157 10/28/12 0500 10/28/12 1707  NA 140 135 134* 132* 134*  K 4.1 4.4 4.2 4.6 3.5  CL 106 103 104 102 104  CO2 19 19 22  17* 22  GLUCOSE 200* 132* 168* 292* 98  BUN 19 17 16 15 11   CREATININE  0.70 0.54 0.75 0.62 0.62  CALCIUM 8.6 8.7 8.7 8.4 8.3*   Liver Function Tests:  Recent Labs Lab 10/26/12 1631  AST 19  ALT 25  ALKPHOS 139*  BILITOT 0.6  PROT 8.6*  ALBUMIN 3.9    Recent Labs Lab 10/26/12 1631  LIPASE 11   No results found for this basename: AMMONIA,  in the last 168 hours CBC:  Recent Labs Lab 10/26/12 1631 10/26/12 1925  WBC 12.2* 12.8*  HGB 14.0 12.7  HCT 40.0 35.4*  MCV 87.0 85.1  PLT 306 339   Cardiac Enzymes: No results found for this basename: CKTOTAL, CKMB,  CKMBINDEX, TROPONINI,  in the last 168 hours BNP (last 3 results) No results found for this basename: PROBNP,  in the last 8760 hours CBG:  Recent Labs Lab 10/28/12 0804 10/28/12 1025 10/28/12 1209 10/28/12 1637 10/28/12 1750  GLUCAP 325* 267* 177* 65* 105*    Recent Results (from the past 240 hour(s))  MRSA PCR SCREENING     Status: None   Collection Time    10/27/12 12:51 AM      Result Value Range Status   MRSA by PCR NEGATIVE  NEGATIVE Final   Comment:            The GeneXpert MRSA Assay (FDA     approved for NASAL specimens     only), is one component of a     comprehensive MRSA colonization     surveillance program. It is not     intended to diagnose MRSA     infection nor to guide or     monitor treatment for     MRSA infections.     Studies:  Recent x-ray studies have been reviewed in detail by the Attending Physician  Scheduled Meds:  Scheduled Meds: . aspirin EC  81 mg Oral QPM  . heparin  5,000 Units Subcutaneous Q8H  . [START ON 10/29/2012] insulin aspart  0-9 Units Subcutaneous TID WC  . insulin aspart  12 Units Subcutaneous TID WC  . [START ON 10/29/2012] insulin glargine  24 Units Subcutaneous Daily   Continuous Infusions: . sodium chloride 100 mL/hr at 10/28/12 1800    Time spent on care of this patient: 25 minutes   [facility]  [facility] Office  [phone] Pager - Text Page per Loretha Stapler as per below:  On-Call/Text Page:      [email or web address]      password [email or web address]  If 7PM-7AM, please contact night-coverage [email or web address] Password [email or web address] 10/28/2012, 7:12 PM   LOS: 2 days

## 2012-10-28 NOTE — Progress Notes (Signed)
Inpatient Diabetes Program Recommendations  AACE/ADA: New Consensus Statement on Inpatient Glycemic Control (2013)  Target Ranges:  Prepandial:   less than 140 mg/dL      Peak postprandial:   less than 180 mg/dL (1-2 hours)      Critically ill patients:  140 - 180 mg/dL   Reason for Visit: Note patient admitted with DKA.  She see's Dr. Sharl Ma and has plans to start insulin pump.  Reviewed sick day rules with patient and pathophysiology of DKA.  Explained importance of frequent monitoring, staying hydrated and taking insulin even when unable to eat.  She verbalized understanding.  States she still does not have much appetite.  May need to decrease meal coverage to 6 units tid with meals.

## 2012-10-29 LAB — GLUCOSE, CAPILLARY: Glucose-Capillary: 120 mg/dL — ABNORMAL HIGH (ref 70–99)

## 2012-10-29 MED ORDER — INSULIN GLARGINE 100 UNIT/ML ~~LOC~~ SOLN: 24.0000 [IU] | Freq: Every day | SUBCUTANEOUS | Status: DC

## 2012-10-29 MED ORDER — INSULIN GLARGINE 100 UNIT/ML ~~LOC~~ SOLN: 18.0000 [IU] | Freq: Two times a day (BID) | SUBCUTANEOUS | Status: DC

## 2012-10-29 MED ORDER — POTASSIUM CHLORIDE CRYS ER 20 MEQ PO TBCR
20.0000 meq | EXTENDED_RELEASE_TABLET | Freq: Once | ORAL | Status: AC
Start: 2012-10-29 — End: 2012-10-29
  Administered 2012-10-29: 20 meq via ORAL
  Filled 2012-10-29: qty 1

## 2012-10-29 MED ORDER — INSULIN ASPART 100 UNIT/ML ~~LOC~~ SOLN
12.0000 [IU] | Freq: Three times a day (TID) | SUBCUTANEOUS | Status: DC
  Administered 2012-10-29 (×2): 12 [IU] via SUBCUTANEOUS

## 2012-10-29 MED ORDER — INSULIN ASPART 100 UNIT/ML ~~LOC~~ SOLN
0.0000 [IU] | Freq: Three times a day (TID) | SUBCUTANEOUS | Status: DC
  Administered 2012-10-29: 3 [IU] via SUBCUTANEOUS

## 2012-10-29 MED ORDER — ONDANSETRON HCL 4 MG PO TABS: 4.0000 mg | ORAL_TABLET | Freq: Three times a day (TID) | ORAL | Status: DC | PRN

## 2012-10-29 NOTE — Progress Notes (Signed)
Pt. discharged to floor,verbalized understanding of discharged instruction,medication,restriction,diet and follow up appointment.Baseline Vitals sign stable,Pt comfortable,no sign and symptom of distress. 

## 2012-10-29 NOTE — Progress Notes (Addendum)
Inpatient Diabetes Program Recommendations  AACE/ADA: New Consensus Statement on Inpatient Glycemic Control (2013)  Target Ranges:  Prepandial:   less than 140 mg/dL      Peak postprandial:   less than 180 mg/dL (1-2 hours)      Critically ill patients:  140 - 180 mg/dL   Reason for Visit: Results for Helen [name], Helen [name] (MRN [id number]) as of 10/29/2012 09:42  Ref. Range 10/28/2012 16:37 10/28/2012 17:50 10/28/2012 19:29 10/28/2012 21:39 10/29/2012 07:47  Glucose-Capillary Latest Range: 70-99 mg/dL 65 (L) 409 (H) 811 (H) 152 (H) 247 (H)   Note that patient admitted with DKA.  Talked with Dr. Sharl Ma regarding patients home diabetes regimen.  He states that She was taking Lantus 18 units bid, Novolog 1 units for every 3 grams of CHO and Novolog correction 1 unit for every 16 mg/dL greater than 914 mg/dL.  Note that when low occurred yesterday it was after Novolog.  Fasting CBG is 247 mg/dL.  Please consider reducing Novolog meal coverage to 5 units tid with meals (while in the hospital) since patient's appetite is decreased.  Also please change Lantus to 18 units bid.  Patient should be able to resume home diabetes regimen at discharge.  She will need to follow-up with Dr. Sharl Ma next week and reschedule her insulin pump start with CDE Bonita Quin at Dr. Daune Perch office.     Note:Will talk to patient prior to discharge.   Addendum:   Made patient follow-up appointments with Dr. Sharl Ma. November 02, 2012 with Cristy Folks, CDE at 1:30 for insulin pump start November 03, 2012 with Dr. Sharl Ma at 4:15 p  Instructed patient to call office on Monday April 7, regarding insulin instructions prior to insulin pump start.

## 2012-10-29 NOTE — Discharge Summary (Signed)
Physician Discharge Summary  Helen [name]:[id number] DOB: 1956/01/16 DOA: 10/26/2012  PCP: Default, Provider, MD  Admit date: 10/26/2012 Discharge date: 10/29/2012  Time spent: 45  minutes  Recommendations for Outpatient Follow-up:  Follow up with Dr. Sharl [name] as previously scheduled.  Discharge Diagnoses:  Active Problems:   DKA (diabetic ketoacidoses)   Diabetes mellitus type I   Nausea and vomiting   Discharge Condition:  Stable, n/v resolved.  No longer in DKA  Diet recommendation: carb modified.  Filed Weights   10/26/12 2021  Weight: 82.6 kg (182 lb 1.6 oz)    History of present illness at the time of admission Helen [name] is a 57 y.o. female has a past medical history significant for type 1 diabetes followed by Dr. Sharl [name], poorly controlled, with her most hemoglobin A1c of 9 (per patient), on Lantus daily and sliding scale, presents with a chief complaint of nausea vomiting and feeling poorly for 24 hours. She knows that her diabetes is poorly controlled, and she had an appointment tomorrow morning to be set up with an insulin pump. Couple days ago, patient started having abdominal discomfort, with nausea and diarrhea, and inability to have any by mouth intake. Given the fact that she was unable to eat, she has not used any insulin in the past 2 days. She denies any frank fevers, however endorses feeling cold. She denies any burning with urination. She has no chest pain, cough, or sinus pressure. She has no symptoms of an upper respiratory infection. She reports that she has had DKA once in the past. In the emergency room, she was found to be acidotic with a bicarbonate of 12, glucose in the mid 500s, and anion gap. She endorses sick contacts, with a friend having a GI bug about a week ago  Hospital Course:   DKA (diabetic ketoacidoses)  [name] was admitted to step down on an insulin drip.  Her anion gap resolved.  She experienced an episode of hypoglycemia on 4/3.  Lantus does was temporarily decreased.  Dr. Sharl [name] was able to check her labs/meds via EPIC and make recommendations regarding her insulin dosing.  His recommendations will be instituted today just prior to discharge.    Diabetes mellitus type I   A1c at 10.9 on 10/27/12.  Patient is scheduled to see Dr. Sharl [name] for an insulin pump.  Nausea and vomiting  Significantly improved.  Tolerating diet.  Will d/c with zofran.    Discharge Exam: Filed Vitals:   10/28/12 1634 10/28/12 1940 10/28/12 2143 10/29/12 0546  BP: 129/63 149/68 139/80 144/79  Pulse:   87 86  Temp: 99.2 F (37.3 C)  99.3 F (37.4 C) 98.7 F (37.1 C)  TempSrc: Oral  Oral Oral  Resp:  21 16 46  Height:      Weight:      SpO2:  98% 96% 97%    General: WD, WN, female, A&O, NAD, Comfortable in bed. Cardiovascular: rrr no m/r/g, pulses in tact Respiratory: cta, no w/c/r.  No accessory muscle movement Abdomen:  Soft, nt, nd, +bs, no masses Skin: no rashes, bruises, or lesions.   Discharge Instructions     Medication List    TAKE these medications       aspirin EC 81 MG tablet  Take 81 mg by mouth every evening.     cholecalciferol 1000 UNITS tablet  Commonly known as:  VITAMIN D  Take 1,000 Units by mouth every morning.     insulin glargine 100  UNIT/ML injection  Commonly known as:  LANTUS SOLOSTAR  Inject 0.24 mLs (24 Units total) into the skin at bedtime.     lisinopril 5 MG tablet  Commonly known as:  PRINIVIL,ZESTRIL  Take 5 mg by mouth every evening.     NOVOLOG FLEXPEN 100 UNIT/ML injection  Generic drug:  insulin aspart  Inject 5-25 Units into the skin 3 (three) times daily with meals. According to sliding scale     ondansetron 4 MG tablet  Commonly known as:  ZOFRAN  Take 1 tablet (4 mg total) by mouth every 8 (eight) hours as needed for nausea.           Follow-up Information   Follow up with KERR,JEFFREY, MD. Schedule an appointment as soon as possible for a visit in 1 week. (Please  follow up with Dr. Sharl [name] as previously planned.)    Contact information:   [address] [facility] [place] [address] [phone]        The results of significant diagnostics from this hospitalization (including imaging, microbiology, ancillary and laboratory) are listed below for reference.    Significant Diagnostic Studies: No results found.  Microbiology: Recent Results (from the past 240 hour(s))  MRSA PCR SCREENING     Status: None   Collection Time    10/27/12 12:51 AM      Result Value Range Status   MRSA by PCR NEGATIVE  NEGATIVE Final   Comment:            The GeneXpert MRSA Assay (FDA     approved for NASAL specimens     only), is one component of a     comprehensive MRSA colonization     surveillance program. It is not     intended to diagnose MRSA     infection nor to guide or     monitor treatment for     MRSA infections.     Labs: Basic Metabolic Panel:  Recent Labs Lab 10/27/12 0620 10/27/12 1648 10/27/12 2157 10/28/12 0500 10/28/12 1707  NA 140 135 134* 132* 134*  K 4.1 4.4 4.2 4.6 3.5  CL 106 103 104 102 104  CO2 19 19 22  17* 22  GLUCOSE 200* 132* 168* 292* 98  BUN 19 17 16 15 11   CREATININE 0.70 0.54 0.75 0.62 0.62  CALCIUM 8.6 8.7 8.7 8.4 8.3*   Liver Function Tests:  Recent Labs Lab 10/26/12 1631  AST 19  ALT 25  ALKPHOS 139*  BILITOT 0.6  PROT 8.6*  ALBUMIN 3.9    Recent Labs Lab 10/26/12 1631  LIPASE 11   CBC:  Recent Labs Lab 10/26/12 1631 10/26/12 1925  WBC 12.2* 12.8*  HGB 14.0 12.7  HCT 40.0 35.4*  MCV 87.0 85.1  PLT 306 339   CBG:  Recent Labs Lab 10/28/12 1637 10/28/12 1750 10/28/12 1929 10/28/12 2139 10/29/12 0747  GLUCAP 65* 105* 139* 152* 247*       Signed:  Stephani Police, PA-C [facility] 10/29/2012, 10:31 AM

## 2013-06-21 ENCOUNTER — Other Ambulatory Visit (HOSPITAL_COMMUNITY): Payer: Self-pay | Admitting: Obstetrics & Gynecology

## 2013-06-21 DIAGNOSIS — Z139 Encounter for screening, unspecified: Secondary | ICD-10-CM

## 2013-07-25 ENCOUNTER — Ambulatory Visit (HOSPITAL_COMMUNITY): Payer: BC Managed Care – PPO

## 2013-09-19 ENCOUNTER — Ambulatory Visit (HOSPITAL_COMMUNITY)
Admission: RE | Admit: 2013-09-19 | Discharge: 2013-09-19 | Disposition: A | Discharge status: Home / Self Care | Payer: BC Managed Care – PPO | Source: Ambulatory Visit | Attending: Obstetrics & Gynecology | Admitting: Obstetrics & Gynecology

## 2013-09-19 ENCOUNTER — Ambulatory Visit (HOSPITAL_COMMUNITY): Payer: BC Managed Care – PPO

## 2013-09-19 DIAGNOSIS — Z139 Encounter for screening, unspecified: Secondary | ICD-10-CM

## 2013-09-19 DIAGNOSIS — Z1231 Encounter for screening mammogram for malignant neoplasm of breast: Secondary | ICD-10-CM | POA: Insufficient documentation

## 2014-04-03 ENCOUNTER — Encounter (HOSPITAL_COMMUNITY): Payer: Self-pay | Admitting: Emergency Medicine

## 2014-04-03 ENCOUNTER — Emergency Department (HOSPITAL_COMMUNITY)
Admission: EM | Admit: 2014-04-03 | Discharge: 2014-04-03 | Disposition: A | Discharge status: Home / Self Care | Payer: BC Managed Care – PPO | Attending: Emergency Medicine | Admitting: Emergency Medicine

## 2014-04-03 DIAGNOSIS — I959 Hypotension, unspecified: Secondary | ICD-10-CM | POA: Diagnosis not present

## 2014-04-03 DIAGNOSIS — Z7982 Long term (current) use of aspirin: Secondary | ICD-10-CM | POA: Diagnosis not present

## 2014-04-03 DIAGNOSIS — Z8619 Personal history of other infectious and parasitic diseases: Secondary | ICD-10-CM | POA: Diagnosis not present

## 2014-04-03 DIAGNOSIS — R739 Hyperglycemia, unspecified: Secondary | ICD-10-CM

## 2014-04-03 DIAGNOSIS — Z9089 Acquired absence of other organs: Secondary | ICD-10-CM | POA: Insufficient documentation

## 2014-04-03 DIAGNOSIS — E860 Dehydration: Secondary | ICD-10-CM

## 2014-04-03 DIAGNOSIS — R1110 Vomiting, unspecified: Secondary | ICD-10-CM | POA: Diagnosis not present

## 2014-04-03 DIAGNOSIS — E119 Type 2 diabetes mellitus without complications: Secondary | ICD-10-CM | POA: Diagnosis not present

## 2014-04-03 DIAGNOSIS — Z794 Long term (current) use of insulin: Secondary | ICD-10-CM | POA: Diagnosis not present

## 2014-04-03 LAB — URINALYSIS, ROUTINE W REFLEX MICROSCOPIC
GLUCOSE, UA: 100 mg/dL — AB
HGB URINE DIPSTICK: NEGATIVE
Ketones, ur: >80 mg/dL — AB
LEUKOCYTES UA: NEGATIVE
Nitrite: NEGATIVE
PH: 5 (ref 5.0–8.0)
Protein, ur: 30 mg/dL — AB
SPECIFIC GRAVITY, URINE: 1.022 (ref 1.005–1.030)
Urobilinogen, UA: 0.2 mg/dL (ref 0.0–1.0)

## 2014-04-03 LAB — CBC
HEMATOCRIT: 34.9 % — AB (ref 36.0–46.0)
HEMOGLOBIN: 11.5 g/dL — AB (ref 12.0–15.0)
MCH: 29.3 pg (ref 26.0–34.0)
MCHC: 33 g/dL (ref 30.0–36.0)
MCV: 88.8 fL (ref 78.0–100.0)
Platelets: 247 10*3/uL (ref 150–400)
RBC: 3.93 MIL/uL (ref 3.87–5.11)
RDW: 12.2 % (ref 11.5–15.5)
WBC: 7.7 10*3/uL (ref 4.0–10.5)

## 2014-04-03 LAB — I-STAT CHEM 8, ED
BUN: 32 mg/dL — ABNORMAL HIGH (ref 6–23)
CHLORIDE: 111 meq/L (ref 96–112)
CREATININE: 1.2 mg/dL — AB (ref 0.50–1.10)
Calcium, Ion: 1.19 mmol/L (ref 1.12–1.23)
GLUCOSE: 169 mg/dL — AB (ref 70–99)
HEMATOCRIT: 34 % — AB (ref 36.0–46.0)
HEMOGLOBIN: 11.6 g/dL — AB (ref 12.0–15.0)
POTASSIUM: 4.4 meq/L (ref 3.7–5.3)
SODIUM: 138 meq/L (ref 137–147)
TCO2: 16 mmol/L (ref 0–100)

## 2014-04-03 LAB — URINE MICROSCOPIC-ADD ON

## 2014-04-03 LAB — CBG MONITORING, ED
GLUCOSE-CAPILLARY: 99 mg/dL (ref 70–99)
Glucose-Capillary: 232 mg/dL — ABNORMAL HIGH (ref 70–99)

## 2014-04-03 MED ORDER — SODIUM CHLORIDE 0.9 % IV BOLUS (SEPSIS)
1000.0000 mL | Freq: Once | INTRAVENOUS | Status: AC
  Administered 2014-04-03: 1000 mL via INTRAVENOUS

## 2014-04-03 MED ORDER — ONDANSETRON 4 MG PO TBDP: 4.0000 mg | ORAL_TABLET | Freq: Three times a day (TID) | ORAL | Status: DC | PRN

## 2014-04-03 MED ORDER — SODIUM CHLORIDE 0.9 % IV BOLUS (SEPSIS): 1000.0000 mL | Freq: Once | INTRAVENOUS | Status: DC

## 2014-04-03 MED ORDER — PROMETHAZINE HCL 25 MG/ML IJ SOLN
25.0000 mg | Freq: Once | INTRAMUSCULAR | Status: AC
  Administered 2014-04-03: 25 mg via INTRAMUSCULAR
  Filled 2014-04-03: qty 1

## 2014-04-03 MED ORDER — ONDANSETRON HCL 4 MG/2ML IJ SOLN
4.0000 mg | Freq: Once | INTRAMUSCULAR | Status: AC
  Administered 2014-04-03: 4 mg via INTRAVENOUS
  Filled 2014-04-03: qty 2

## 2014-04-03 NOTE — ED Provider Notes (Signed)
CSN: [id number]     Arrival date & time 04/03/14  1221 History   First MD Initiated Contact with Patient 04/03/14 1224     Chief Complaint  Patient presents with  . Hyperglycemia  . Emesis  . Hypotension     (Consider location/radiation/quality/duration/timing/severity/associated sxs/prior Treatment) HPI Comments: 58 year old female with a history of diabetes on insulin pump presents to the hospital with a complaint of hyperglycemia. The patient states that she ran out of her insulin last night and had some initial increased urinary frequency and thirst however upon awakening this morning she started to feel lightheaded and felt as though her blood pressure was dropping. Her blood sugar was measured at 308 by paramedics though the patient was 470 at home. She had multiple episodes of nausea vomiting and diarrhea this morning. She denies pain, fever, shortness of breath, chest pain, back pain, dysuria, swelling, or rashes. There is no focal weakness or numbness.  Patient is a 58 y.o. female presenting with hyperglycemia and vomiting. The history is provided by the patient and the EMS personnel.  Hyperglycemia Associated symptoms: vomiting   Emesis   Past Medical History  Diagnosis Date  . Shingles   . Diabetes mellitus without complication    Past Surgical History  Procedure Laterality Date  . Cholecystectomy     No family history on file. History  Substance Use Topics  . Smoking status: Never Smoker   . Smokeless tobacco: Not on file  . Alcohol Use: No   OB History   Grav Para Term Preterm Abortions TAB SAB Ect Mult Living                 Review of Systems  Gastrointestinal: Positive for vomiting.  All other systems reviewed and are negative.     Allergies  Review of patient's allergies indicates no known allergies.  Home Medications   Prior to Admission medications   Medication Sig Start Date End Date Taking? Authorizing Provider  aspirin EC 81 MG tablet Take 81  mg by mouth every evening.   Yes Historical Provider, MD  cholecalciferol (VITAMIN D) 1000 UNITS tablet Take 1,000 Units by mouth every morning.   Yes Historical Provider, MD  lisinopril (PRINIVIL,ZESTRIL) 5 MG tablet Take 5 mg by mouth every evening. 06/16/12  Yes Historical Provider, MD  naproxen sodium (ANAPROX) 220 MG tablet Take 440 mg by mouth daily as needed (headaches).   Yes Historical Provider, MD  NOVOLOG FLEXPEN 100 UNIT/ML injection Inject 5-25 Units into the skin 3 (three) times daily with meals. According to sliding scale 06/18/12  Yes Historical Provider, MD  ondansetron (ZOFRAN ODT) 4 MG disintegrating tablet Take 1 tablet (4 mg total) by mouth every 8 (eight) hours as needed for nausea. 04/03/14   Vida Roller, MD   BP 114/45  Pulse 86  Temp(Src) 97.8 F (36.6 C) (Oral)  Resp 18  SpO2 99% Physical Exam  Nursing note and vitals reviewed. Constitutional: She appears well-developed and well-nourished. No distress.  HENT:  Head: Normocephalic and atraumatic.  Mouth/Throat: Oropharynx is clear and moist. No oropharyngeal exudate.  Eyes: Conjunctivae and EOM are normal. Pupils are equal, round, and reactive to light. Right eye exhibits no discharge. Left eye exhibits no discharge. No scleral icterus.  Neck: Normal range of motion. Neck supple. No JVD present. No thyromegaly present.  Cardiovascular: Normal rate, regular rhythm, normal heart sounds and intact distal pulses.  Exam reveals no gallop and no friction rub.   No murmur heard.  Pulmonary/Chest: Effort normal and breath sounds normal. No respiratory distress. She has no wheezes. She has no rales.  Abdominal: Soft. Bowel sounds are normal. She exhibits no distension and no mass. There is no tenderness.  Musculoskeletal: Normal range of motion. She exhibits no edema and no tenderness.  Lymphadenopathy:    She has no cervical adenopathy.  Neurological: She is alert. Coordination normal.  Skin: Skin is warm and dry. No  rash noted. No erythema.  Psychiatric: She has a normal mood and affect. Her behavior is normal.    ED Course  Procedures (including critical care time) Labs Review Labs Reviewed  CBC - Abnormal; Notable for the following:    Hemoglobin 11.5 (*)    HCT 34.9 (*)    All other components within normal limits  URINALYSIS, ROUTINE W REFLEX MICROSCOPIC - Abnormal; Notable for the following:    Glucose, UA 100 (*)    Bilirubin Urine LARGE (*)    Ketones, ur >80 (*)    Protein, ur 30 (*)    All other components within normal limits  URINE MICROSCOPIC-ADD ON - Abnormal; Notable for the following:    Squamous Epithelial / LPF MANY (*)    All other components within normal limits  CBG MONITORING, ED - Abnormal; Notable for the following:    Glucose-Capillary 232 (*)    All other components within normal limits  I-STAT CHEM 8, ED - Abnormal; Notable for the following:    BUN 32 (*)    Creatinine, Ser 1.20 (*)    Glucose, Bld 169 (*)    Hemoglobin 11.6 (*)    HCT 34.0 (*)    All other components within normal limits  CBG MONITORING, ED    Imaging Review No results found.   MDM   Final diagnoses:  Hyperglycemia  Dehydration    The patient is not in distress at this time, she does not have tachycardia or fever though she does have hypotension with a blood pressure of 90 systolic. She will need IV fluids and symptomatic control with treatment for hyperglycemia as well. The patient is in agreement with the plan.  Fluids, meds, improved an tolerating PO,  CBG improved, pt amenable to d/c.  Meds given in ED:  Medications  sodium chloride 0.9 % bolus 1,000 mL (0 mLs Intravenous Stopped 04/03/14 1329)  sodium chloride 0.9 % bolus 1,000 mL (0 mLs Intravenous Stopped 04/03/14 1517)  ondansetron (ZOFRAN) injection 4 mg (4 mg Intravenous Given 04/03/14 1307)  promethazine (PHENERGAN) injection 25 mg (25 mg Intramuscular Given 04/03/14 1503)    Discharge Medication List as of 04/03/2014  3:35  PM    START taking these medications   Details  ondansetron (ZOFRAN ODT) 4 MG disintegrating tablet Take 1 tablet (4 mg total) by mouth every 8 (eight) hours as needed for nausea., Starting 04/03/2014, Until Discontinued, Print          Vida Roller, MD 04/05/14 2312

## 2014-04-03 NOTE — Discharge Instructions (Signed)
Please call your doctor for a followup appointment within 24-48 hours. When you talk to your doctor please let them know that you were seen in the emergency department and have them acquire all of your records so that they can discuss the findings with you and formulate a treatment plan to fully care for your new and ongoing problems. ° °

## 2014-04-03 NOTE — ED Notes (Signed)
CBG- 99 

## 2014-04-03 NOTE — ED Notes (Signed)
Pt arrived by [facility] from home with c/o hyperglycemia, hypotension, n/v/d. EMS stated that BP initially was 70/42 and administered 1L of fluid, BP increased to 100 systolic. Pt is type 1 diabetic and has insulin pump but ran out of insulin yesterday and has not had pump since. Pt did self administered insulin Novolog 30 units injection. EMS CBG 308 pt stated that pt saw CBG as high as 470. Pt has been vomiting and having diarrhea since this morning. Denis any pain.

## 2014-08-23 ENCOUNTER — Other Ambulatory Visit (HOSPITAL_COMMUNITY): Payer: Self-pay | Admitting: Internal Medicine

## 2014-08-23 DIAGNOSIS — Z1231 Encounter for screening mammogram for malignant neoplasm of breast: Secondary | ICD-10-CM

## 2014-09-25 ENCOUNTER — Ambulatory Visit (HOSPITAL_COMMUNITY)
Admission: RE | Admit: 2014-09-25 | Discharge: 2014-09-25 | Disposition: A | Discharge status: Home / Self Care | Payer: BC Managed Care – PPO | Source: Ambulatory Visit | Attending: Internal Medicine | Admitting: Internal Medicine

## 2014-09-25 DIAGNOSIS — Z1231 Encounter for screening mammogram for malignant neoplasm of breast: Secondary | ICD-10-CM | POA: Insufficient documentation

## 2014-11-21 ENCOUNTER — Encounter: Payer: BC Managed Care – PPO | Attending: Internal Medicine | Admitting: *Deleted

## 2014-11-21 DIAGNOSIS — E108 Type 1 diabetes mellitus with unspecified complications: Secondary | ICD-10-CM | POA: Diagnosis not present

## 2014-11-21 DIAGNOSIS — Z9641 Presence of insulin pump (external) (internal): Secondary | ICD-10-CM | POA: Insufficient documentation

## 2014-11-24 NOTE — Progress Notes (Signed)
Introduction to CGM Therapy:  Date: 11/19/14  Appt start time: 1430 end time:  1530.  Assessment:  This patient has DM and their primary concerns today: to learn more about options to add CGM to her pump therapy. She is currently on Med[employer]onic Revel insulin pump and would like to know more about CGM, how it would benefit her and which company options she has.   MEDICATIONS: See list, she uses Novolog in her insulin pump  Patient states they are currently testing 3-5 times per day  Patient states knowledge of Carb Counting is fair  Progress Towards Obtaining CGM):  In progress.  Patient states their expectations of CGM include: unknown, she wants more information today    Intervention:    Discussed difference between blood glucose and sensor glucose, the need for calibration twice daily, what alerts are available and when they should be used, integration options, and reports available.  Discussed 2 CGM company options and probable advantages to using [employer] since it would integrate with her current pump or if she is out of warranty and wants to upgrade to 530G she could.   Discussed current Continuous Glucose Monitoring options  Handouts given during visit include:  Hand written review of CGM  Monitoring/Evaluation:    Patient does want to continue with pursuit of [employer] CGM  Patient requested that I contact that Pump Company Rep so they can be contacted to start the process of obtaining the CGM.  Once CGM is shipped, patient to call this office to set up training.

## 2015-01-01 ENCOUNTER — Ambulatory Visit (INDEPENDENT_AMBULATORY_CARE_PROVIDER_SITE_OTHER): Payer: BC Managed Care – PPO | Admitting: *Deleted

## 2015-01-01 DIAGNOSIS — Z111 Encounter for screening for respiratory tuberculosis: Secondary | ICD-10-CM | POA: Diagnosis not present

## 2015-01-03 ENCOUNTER — Ambulatory Visit: Payer: BC Managed Care – PPO | Admitting: Family Medicine

## 2015-01-03 DIAGNOSIS — Z111 Encounter for screening for respiratory tuberculosis: Secondary | ICD-10-CM

## 2015-01-03 LAB — TB SKIN TEST
INDURATION: 0 mm
TB SKIN TEST: NEGATIVE

## 2015-03-05 ENCOUNTER — Encounter (HOSPITAL_COMMUNITY): Payer: Self-pay | Admitting: Emergency Medicine

## 2015-03-05 ENCOUNTER — Inpatient Hospital Stay (HOSPITAL_COMMUNITY)
Admission: EM | Admit: 2015-03-05 | Discharge: 2015-03-07 | DRG: 919 | Disposition: A | Discharge status: Home / Self Care | Payer: BC Managed Care – PPO | Attending: Internal Medicine | Admitting: Internal Medicine

## 2015-03-05 DIAGNOSIS — R739 Hyperglycemia, unspecified: Secondary | ICD-10-CM | POA: Diagnosis not present

## 2015-03-05 DIAGNOSIS — I10 Essential (primary) hypertension: Secondary | ICD-10-CM | POA: Diagnosis present

## 2015-03-05 DIAGNOSIS — E875 Hyperkalemia: Secondary | ICD-10-CM | POA: Diagnosis present

## 2015-03-05 DIAGNOSIS — R0789 Other chest pain: Secondary | ICD-10-CM

## 2015-03-05 DIAGNOSIS — E1110 Type 2 diabetes mellitus with ketoacidosis without coma: Secondary | ICD-10-CM | POA: Diagnosis present

## 2015-03-05 DIAGNOSIS — T85694A Other mechanical complication of insulin pump, initial encounter: Secondary | ICD-10-CM | POA: Diagnosis not present

## 2015-03-05 DIAGNOSIS — Z794 Long term (current) use of insulin: Secondary | ICD-10-CM

## 2015-03-05 DIAGNOSIS — Z7982 Long term (current) use of aspirin: Secondary | ICD-10-CM

## 2015-03-05 DIAGNOSIS — E109 Type 1 diabetes mellitus without complications: Secondary | ICD-10-CM | POA: Diagnosis present

## 2015-03-05 DIAGNOSIS — Z9049 Acquired absence of other specified parts of digestive tract: Secondary | ICD-10-CM | POA: Diagnosis present

## 2015-03-05 DIAGNOSIS — E0810 Diabetes mellitus due to underlying condition with ketoacidosis without coma: Secondary | ICD-10-CM | POA: Diagnosis not present

## 2015-03-05 DIAGNOSIS — Y848 Other medical procedures as the cause of abnormal reaction of the patient, or of later complication, without mention of misadventure at the time of the procedure: Secondary | ICD-10-CM | POA: Diagnosis present

## 2015-03-05 DIAGNOSIS — Z9641 Presence of insulin pump (external) (internal): Secondary | ICD-10-CM | POA: Diagnosis present

## 2015-03-05 DIAGNOSIS — R079 Chest pain, unspecified: Secondary | ICD-10-CM | POA: Diagnosis present

## 2015-03-05 DIAGNOSIS — R112 Nausea with vomiting, unspecified: Secondary | ICD-10-CM | POA: Diagnosis not present

## 2015-03-05 DIAGNOSIS — E1010 Type 1 diabetes mellitus with ketoacidosis without coma: Secondary | ICD-10-CM | POA: Diagnosis present

## 2015-03-05 LAB — URINALYSIS, ROUTINE W REFLEX MICROSCOPIC
Bilirubin Urine: NEGATIVE
Glucose, UA: >1000 mg/dL — AB
HGB URINE DIPSTICK: NEGATIVE
Leukocytes, UA: NEGATIVE
Nitrite: NEGATIVE
PH: 5.5 (ref 5.0–8.0)
Protein, ur: NEGATIVE mg/dL
SPECIFIC GRAVITY, URINE: 1.02 (ref 1.005–1.030)
UROBILINOGEN UA: 0.2 mg/dL (ref 0.0–1.0)

## 2015-03-05 LAB — BASIC METABOLIC PANEL
ANION GAP: 19 — AB (ref 5–15)
Anion gap: 11 (ref 5–15)
Anion gap: 10 (ref 5–15)
BUN: 26 mg/dL — AB (ref 6–20)
BUN: 22 mg/dL — ABNORMAL HIGH (ref 6–20)
BUN: 21 mg/dL — AB (ref 6–20)
CALCIUM: 7.9 mg/dL — AB (ref 8.9–10.3)
CHLORIDE: 98 mmol/L — AB (ref 101–111)
CHLORIDE: 112 mmol/L — AB (ref 101–111)
CO2: 17 mmol/L — AB (ref 22–32)
CO2: 17 mmol/L — ABNORMAL LOW (ref 22–32)
CO2: 17 mmol/L — ABNORMAL LOW (ref 22–32)
CREATININE: 0.83 mg/dL (ref 0.44–1.00)
Calcium: 9.4 mg/dL (ref 8.9–10.3)
Calcium: 8.1 mg/dL — ABNORMAL LOW (ref 8.9–10.3)
Chloride: 111 mmol/L (ref 101–111)
Creatinine, Ser: 1.04 mg/dL — ABNORMAL HIGH (ref 0.44–1.00)
Creatinine, Ser: 0.77 mg/dL (ref 0.44–1.00)
GFR calc Af Amer: >60 mL/min (ref >60)
GFR calc Af Amer: >60 mL/min (ref >60)
GFR calc non Af Amer: 58 mL/min — ABNORMAL LOW (ref >60)
GLUCOSE: 505 mg/dL — AB (ref 65–99)
GLUCOSE: 235 mg/dL — AB (ref 65–99)
GLUCOSE: 177 mg/dL — AB (ref 65–99)
POTASSIUM: 5.3 mmol/L — AB (ref 3.5–5.1)
POTASSIUM: 4 mmol/L (ref 3.5–5.1)
Potassium: 4.5 mmol/L (ref 3.5–5.1)
SODIUM: 138 mmol/L (ref 135–145)
Sodium: 134 mmol/L — ABNORMAL LOW (ref 135–145)
Sodium: 140 mmol/L (ref 135–145)

## 2015-03-05 LAB — CBC
HCT: 38.9 % (ref 36.0–46.0)
HEMOGLOBIN: 13 g/dL (ref 12.0–15.0)
MCH: 29.9 pg (ref 26.0–34.0)
MCHC: 33.4 g/dL (ref 30.0–36.0)
MCV: 89.4 fL (ref 78.0–100.0)
PLATELETS: 277 10*3/uL (ref 150–400)
RBC: 4.35 MIL/uL (ref 3.87–5.11)
RDW: 12.4 % (ref 11.5–15.5)
WBC: 9.4 10*3/uL (ref 4.0–10.5)

## 2015-03-05 LAB — TROPONIN I: Troponin I: <0.03 ng/mL (ref <0.031)

## 2015-03-05 LAB — URINE MICROSCOPIC-ADD ON

## 2015-03-05 LAB — CBG MONITORING, ED
GLUCOSE-CAPILLARY: 444 mg/dL — AB (ref 65–99)
GLUCOSE-CAPILLARY: 439 mg/dL — AB (ref 65–99)
GLUCOSE-CAPILLARY: 166 mg/dL — AB (ref 65–99)
Glucose-Capillary: 384 mg/dL — ABNORMAL HIGH (ref 65–99)
Glucose-Capillary: 250 mg/dL — ABNORMAL HIGH (ref 65–99)
Glucose-Capillary: 360 mg/dL — ABNORMAL HIGH (ref 65–99)
Glucose-Capillary: 211 mg/dL — ABNORMAL HIGH (ref 65–99)

## 2015-03-05 LAB — GLUCOSE, CAPILLARY
GLUCOSE-CAPILLARY: 154 mg/dL — AB (ref 65–99)
Glucose-Capillary: 156 mg/dL — ABNORMAL HIGH (ref 65–99)

## 2015-03-05 LAB — HEPATIC FUNCTION PANEL
ALBUMIN: 3.2 g/dL — AB (ref 3.5–5.0)
ALK PHOS: 136 U/L — AB (ref 38–126)
ALT: 43 U/L (ref 14–54)
AST: 25 U/L (ref 15–41)
BILIRUBIN INDIRECT: 1.3 mg/dL — AB (ref 0.3–0.9)
Bilirubin, Direct: 0.1 mg/dL (ref 0.1–0.5)
Total Bilirubin: 1.4 mg/dL — ABNORMAL HIGH (ref 0.3–1.2)
Total Protein: 6.7 g/dL (ref 6.5–8.1)

## 2015-03-05 LAB — LIPASE, BLOOD: Lipase: 10 U/L — ABNORMAL LOW (ref 22–51)

## 2015-03-05 MED ORDER — ASPIRIN EC 81 MG PO TBEC
81.0000 mg | DELAYED_RELEASE_TABLET | Freq: Every evening | ORAL | Status: DC
  Administered 2015-03-05 – 2015-03-06 (×2): 81 mg via ORAL
  Filled 2015-03-05 (×2): qty 1

## 2015-03-05 MED ORDER — POTASSIUM CHLORIDE 10 MEQ/100ML IV SOLN
10.0000 meq | INTRAVENOUS | Status: AC
  Administered 2015-03-05: 10 meq via INTRAVENOUS
  Filled 2015-03-05: qty 100

## 2015-03-05 MED ORDER — DEXTROSE-NACL 5-0.45 % IV SOLN
INTRAVENOUS | Status: DC
  Administered 2015-03-05 – 2015-03-07 (×4): via INTRAVENOUS

## 2015-03-05 MED ORDER — LISINOPRIL 5 MG PO TABS
5.0000 mg | ORAL_TABLET | Freq: Every evening | ORAL | Status: DC
  Administered 2015-03-05 – 2015-03-06 (×2): 5 mg via ORAL
  Filled 2015-03-05 (×2): qty 1

## 2015-03-05 MED ORDER — DEXTROSE-NACL 5-0.45 % IV SOLN
INTRAVENOUS | Status: DC
  Administered 2015-03-05: 1000 mL via INTRAVENOUS

## 2015-03-05 MED ORDER — SODIUM CHLORIDE 0.9 % IV SOLN
Freq: Once | INTRAVENOUS | Status: AC
  Administered 2015-03-05: 16:00:00 via INTRAVENOUS

## 2015-03-05 MED ORDER — SODIUM CHLORIDE 0.9 % IV BOLUS (SEPSIS)
1000.0000 mL | Freq: Once | INTRAVENOUS | Status: AC
  Administered 2015-03-05: 1000 mL via INTRAVENOUS

## 2015-03-05 MED ORDER — SODIUM CHLORIDE 0.9 % IV SOLN
1000.0000 mL | Freq: Once | INTRAVENOUS | Status: AC
  Administered 2015-03-05: 1000 mL via INTRAVENOUS

## 2015-03-05 MED ORDER — SODIUM CHLORIDE 0.9 % IV SOLN
INTRAVENOUS | Status: AC
  Administered 2015-03-05: 23:00:00 via INTRAVENOUS

## 2015-03-05 MED ORDER — VITAMIN D 1000 UNITS PO TABS
1000.0000 [IU] | ORAL_TABLET | Freq: Every morning | ORAL | Status: DC
  Administered 2015-03-06 – 2015-03-07 (×2): 1000 [IU] via ORAL
  Filled 2015-03-05 (×2): qty 1

## 2015-03-05 MED ORDER — SODIUM CHLORIDE 0.9 % IV SOLN
INTRAVENOUS | Status: DC
Start: 2015-03-05 — End: 2015-03-06
  Administered 2015-03-05: 23:00:00 via INTRAVENOUS

## 2015-03-05 MED ORDER — PROMETHAZINE HCL 25 MG/ML IJ SOLN
12.5000 mg | Freq: Once | INTRAMUSCULAR | Status: AC
  Administered 2015-03-05: 12.5 mg via INTRAVENOUS
  Filled 2015-03-05: qty 1

## 2015-03-05 MED ORDER — SODIUM CHLORIDE 0.9 % IV SOLN
INTRAVENOUS | Status: DC
  Filled 2015-03-05 (×2): qty 2.5

## 2015-03-05 MED ORDER — SODIUM CHLORIDE 0.9 % IV SOLN
1000.0000 mL | INTRAVENOUS | Status: DC
  Administered 2015-03-05: 1000 mL via INTRAVENOUS

## 2015-03-05 MED ORDER — ONDANSETRON HCL 4 MG/2ML IJ SOLN
8.0000 mg | Freq: Once | INTRAMUSCULAR | Status: AC
  Administered 2015-03-05: 8 mg via INTRAVENOUS
  Filled 2015-03-05: qty 4

## 2015-03-05 MED ORDER — SODIUM CHLORIDE 0.9 % IV SOLN
INTRAVENOUS | Status: DC
  Administered 2015-03-05: 3.8 [IU]/h via INTRAVENOUS
  Filled 2015-03-05: qty 2.5

## 2015-03-05 NOTE — ED Notes (Signed)
Pt c/o chest pain. EKG completed and given to Dr. Deretha Emory.

## 2015-03-05 NOTE — ED Provider Notes (Signed)
CSN: [id number]     Arrival date & time 03/05/15  1423 History   First MD Initiated Contact with Patient 03/05/15 1539     Chief Complaint  Patient presents with  . Emesis  . Hyperglycemia      HPI  Patient presents for evaluation of high blood sugars. Has an insulin pump and normally has good control. Asymptomatic in normal day yesterday. Awakened this morning. Mild nausea. Had a high sugar. Yourself a 12 unit correction with breakfast. Nausea and vomiting through the day. She is done to additional corrections of 12, and 17 units respectively. Continued to have high sugars and continued nausea or vomiting she presents here. She describes a minimal amount of anterior chest pain primarily post emesis. No fevers no chills. No shortness of breath. No diarrhea.  Past Medical History  Diagnosis Date  . Shingles   . Diabetes mellitus without complication    Past Surgical History  Procedure Laterality Date  . Cholecystectomy     History reviewed. No pertinent family history. History  Substance Use Topics  . Smoking status: Never Smoker   . Smokeless tobacco: Not on file  . Alcohol Use: No   OB History    No data available     Review of Systems  Constitutional: Negative for fever, chills, diaphoresis, appetite change and fatigue.  HENT: Negative for mouth sores, sore throat and trouble swallowing.   Eyes: Negative for visual disturbance.  Respiratory: Negative for cough, chest tightness, shortness of breath and wheezing.   Cardiovascular: Positive for chest pain.  Gastrointestinal: Positive for nausea and vomiting. Negative for abdominal pain, diarrhea and abdominal distention.  Endocrine: Positive for polydipsia and polyuria. Negative for polyphagia.  Genitourinary: Negative for dysuria, frequency and hematuria.  Musculoskeletal: Negative for gait problem.  Skin: Negative for color change, pallor and rash.  Neurological: Negative for dizziness, syncope, light-headedness and  headaches.  Hematological: Does not bruise/bleed easily.  Psychiatric/Behavioral: Negative for behavioral problems and confusion.      Allergies  Review of patient's allergies indicates no known allergies.  Home Medications   Prior to Admission medications   Medication Sig Start Date End Date Taking? Authorizing Provider  aspirin EC 81 MG tablet Take 81 mg by mouth every evening.   Yes Historical Provider, MD  cholecalciferol (VITAMIN D) 1000 UNITS tablet Take 1,000 Units by mouth every morning.   Yes Historical Provider, MD  lisinopril (PRINIVIL,ZESTRIL) 5 MG tablet Take 5 mg by mouth every evening. 06/16/12  Yes Historical Provider, MD  naproxen sodium (ANAPROX) 220 MG tablet Take 440 mg by mouth daily as needed (headaches).   Yes Historical Provider, MD  NOVOLOG FLEXPEN 100 UNIT/ML injection 0-55 Units by Pump Prime route continuous. Receives up to 55 units per pump daily 06/18/12  Yes Historical Provider, MD   BP 121/50 mmHg  Pulse 100  Temp(Src) 98.6 F (37 C) (Oral)  Resp 16  Ht 5\' 5"  (1.651 m)  Wt 193 lb 2 oz (87.6 kg)  BMI 32.14 kg/m2  SpO2 100% Physical Exam  Constitutional: She is oriented to person, place, and time. She appears well-developed and well-nourished. No distress.  HENT:  Head: Normocephalic.  Eyes: Conjunctivae are normal. Pupils are equal, round, and reactive to light. No scleral icterus.  Neck: Normal range of motion. Neck supple. No thyromegaly present.  Cardiovascular: Normal rate and regular rhythm.  Exam reveals no gallop and no friction rub.   No murmur heard. Pulmonary/Chest: Effort normal and breath sounds normal. No  respiratory distress. She has no wheezes. She has no rales.  Abdominal: Soft. Bowel sounds are normal. She exhibits no distension. There is no tenderness. There is no rebound.  Musculoskeletal: Normal range of motion.  Neurological: She is alert and oriented to person, place, and time.  Skin: Skin is warm and dry. No rash noted.   Psychiatric: She has a normal mood and affect. Her behavior is normal.    ED Course  Procedures (including critical care time) Labs Review Labs Reviewed  BASIC METABOLIC PANEL - Abnormal; Notable for the following:    Sodium 134 (*)    Potassium 5.3 (*)    Chloride 98 (*)    CO2 17 (*)    Glucose, Bld 505 (*)    BUN 26 (*)    Creatinine, Ser 1.04 (*)    GFR calc non Af Amer 58 (*)    Anion gap 19 (*)    All other components within normal limits  URINALYSIS, ROUTINE W REFLEX MICROSCOPIC (NOT AT [facility]) - Abnormal; Notable for the following:    Glucose, UA >1000 (*)    Ketones, ur >80 (*)    All other components within normal limits  BASIC METABOLIC PANEL - Abnormal; Notable for the following:    Chloride 112 (*)    CO2 17 (*)    Glucose, Bld 235 (*)    BUN 22 (*)    Calcium 8.1 (*)    All other components within normal limits  GLUCOSE, CAPILLARY - Abnormal; Notable for the following:    Glucose-Capillary 154 (*)    All other components within normal limits  CBG MONITORING, ED - Abnormal; Notable for the following:    Glucose-Capillary 444 (*)    All other components within normal limits  CBG MONITORING, ED - Abnormal; Notable for the following:    Glucose-Capillary 439 (*)    All other components within normal limits  CBG MONITORING, ED - Abnormal; Notable for the following:    Glucose-Capillary 384 (*)    All other components within normal limits  CBG MONITORING, ED - Abnormal; Notable for the following:    Glucose-Capillary 360 (*)    All other components within normal limits  CBG MONITORING, ED - Abnormal; Notable for the following:    Glucose-Capillary 250 (*)    All other components within normal limits  CBG MONITORING, ED - Abnormal; Notable for the following:    Glucose-Capillary 211 (*)    All other components within normal limits  CBG MONITORING, ED - Abnormal; Notable for the following:    Glucose-Capillary 166 (*)    All other components within normal  limits  MRSA PCR SCREENING  CBC  URINE MICROSCOPIC-ADD ON  TROPONIN I  BASIC METABOLIC PANEL  BASIC METABOLIC PANEL  BASIC METABOLIC PANEL  BASIC METABOLIC PANEL  TROPONIN I  TROPONIN I  TROPONIN I  HEPATIC FUNCTION PANEL  LIPASE, BLOOD    Imaging Review No results found.   EKG Interpretation   Date/Time:  Monday March 05 2015 15:48:22 EDT Ventricular Rate:  95 PR Interval:  139 QRS Duration: 69 QT Interval:  354 QTC Calculation: 445 R Axis:   57 Text Interpretation:  Sinus rhythm Abnormal R-wave progression, early  transition Baseline wander in lead(s) I II aVR No significant change since  last tracing Confirmed by Deretha Emory  MD, SCOTT [phone]) on 03/05/2015 3:51:45  PM Also confirmed by Fayrene Fearing  MD, Adylee Leonardo ([id number])  on 03/05/2015 7:13:51 PM      MDM  Final diagnoses:  Hyperglycemia    CRITICAL CARE Performed by: Rolland Porter JOSEPH   Total critical care time: 60 minutes.  Initiation and maintenance of glucose stabilizer. Fluid boluses.  Critical care time was exclusive of separately billable procedures and treating other patients.  Critical care was necessary to treat or prevent imminent or life-threatening deterioration.  Critical care was time spent personally by me on the following activities: development of treatment plan with patient and/or surrogate as well as nursing, discussions with consultants, evaluation of patient's response to treatment, examination of patient, obtaining history from patient or surrogate, ordering and performing treatments and interventions, ordering and review of laboratory studies, ordering and review of radiographic studies, pulse oximetry and re-evaluation of patient's condition.   Patient placed on glucose stabilizer here. Slightly low bicarbonate. Minimal elevation of anion gap. Normal EKG without change and normal first troponin. Minimal complaints of chest pain and symptom-free here. I discussed the case with Dr. Onalee Hua. Patient  will be admitted for further care and continued insulin and fluid resuscitation.    Rolland Porter, MD 03/05/15 [phone]

## 2015-03-05 NOTE — Progress Notes (Signed)
03/05/15 CBG 444 Spoke with nurse.  Pt should be put on insulin drip.  Her pump should be taken off.  If her labs show that she is in DKA, pt should be put on the DKA protocol. Mellissa Kohut RD, CDE. M.Ed. Pager [phone] Inpatient Diabetes Coordinator

## 2015-03-05 NOTE — ED Notes (Signed)
Helen [name] from diabetes management called to report that pt needs to be taken off pump, and if in dka, put on protocol.

## 2015-03-05 NOTE — H&P (Signed)
PCP:   Leo Grosser, MD   Chief Complaint:  High glucose, n/v  HPI: 59 yo female h/o DM1 for over 20 years comes in with one day of vomiting over 3 times and epigastic/chest pain that lasted over 3 hours today while she was vomiting that resolved with antiemetics in the ED.  Pt reports that her glucose dropped several times yesterday into the 60s with her insulin pump.  She ate an apple fritter, and fish sandwich and when she woke up this am her glucose was over 300 which is very unusual for her.  She bolused herself extra insulin went to work later for lunch her glucose was over 400.  By this time she had vomited several times and had not eaten.  She then decided to come to ED as she was scared something was wrong.  She denies any fevers.  Her abd pain is resolved now.   The pain was in the epigrastric area and radiated up into the chest.  Was persistent for over 3 hours.  She was referred for admission for her chest pain.    At this time she is in stepdown and her gap has already closed.  Her insulin pump was taken off hours ago in the ED.    Review of Systems:  Positive and negative as per HPI otherwise all other systems are negative  Past Medical History: Past Medical History  Diagnosis Date  . Shingles   . Diabetes mellitus without complication    Past Surgical History  Procedure Laterality Date  . Cholecystectomy      Medications: Prior to Admission medications   Medication Sig Start Date End Date Taking? Authorizing Provider  aspirin EC 81 MG tablet Take 81 mg by mouth every evening.   Yes Historical Provider, MD  cholecalciferol (VITAMIN D) 1000 UNITS tablet Take 1,000 Units by mouth every morning.   Yes Historical Provider, MD  lisinopril (PRINIVIL,ZESTRIL) 5 MG tablet Take 5 mg by mouth every evening. 06/16/12  Yes Historical Provider, MD  naproxen sodium (ANAPROX) 220 MG tablet Take 440 mg by mouth daily as needed (headaches).   Yes Historical Provider, MD  NOVOLOG  FLEXPEN 100 UNIT/ML injection 0-55 Units by Pump Prime route continuous. Receives up to 55 units per pump daily 06/18/12  Yes Historical Provider, MD    Allergies:  No Known Allergies  Social History:  reports that she has never smoked. She does not have any smokeless tobacco history on file. She reports that she does not drink alcohol or use illicit drugs.  Family History: History reviewed. No pertinent family history.  Physical Exam: Filed Vitals:   03/05/15 1900 03/05/15 2000 03/05/15 2030 03/05/15 2100  BP: 123/52 112/51 111/53 121/50  Pulse:   93 100  Temp:      TempSrc:      Resp: Height:      Weight:      SpO2:   99% 100%   General appearance: alert, cooperative and no distress Head: Normocephalic, without obvious abnormality, atraumatic Eyes: negative Nose: Nares normal. Septum midline. Mucosa normal. No drainage or sinus tenderness. Neck: no JVD and supple, symmetrical, trachea midline Lungs: clear to auscultation bilaterally Heart: regular rate and rhythm, S1, S2 normal, no murmur, click, rub or gallop Abdomen: soft, non-tender; bowel sounds normal; no masses,  no organomegaly Extremities: extremities normal, atraumatic, no cyanosis or edema Pulses: 2+ and symmetric Skin: Skin color, texture, turgor normal. No rashes or lesions Neurologic: Grossly  normal   Labs on Admission:   Recent Labs  03/05/15 1543 03/05/15 2006  NA 134* 140  K 5.3* 4.5  CL 98* 112*  CO2 17* 17*  GLUCOSE 505* 235*  BUN 26* 22*  CREATININE 1.04* 0.83  CALCIUM 9.4 8.1*    Recent Labs  03/05/15 1543  WBC 9.4  HGB 13.0  HCT 38.9  MCV 89.4  PLT 277    Recent Labs  03/05/15 1543  TROPONINI <0.03    Radiological Exams on Admission No results found.  Assessment/Plan  59 yo female with n/v/abd pain for one day now in DKA  Principal Problem:   DKA (diabetic ketoacidoses)-  Her gap is closed now, sugar is down to 153.  Unclear if this is a pump problem.   Her basal rate is set at 29 units/day.  Will resume her pump now at her basal rate, stop insulin gtt.  Allow her to eat a sandwich now.  Will monitor her glucose thru the night, and in the am will allow her to bolus herself prior to breakfast with her pump.  This way we can monitor if her pump is functioning.  O/w she may have gotten a little viral illness, will add on lft/lipase to make sure nothing else.  Active Problems:   Diabetes mellitus type I-  noted   Nausea and vomiting-  Add on lfts and lipase level.     Chest pain-  Trop and ekg good.  Serial trop overnight, check echo in am but pain sounds GI related.   Hyperglycemia  As above  obs stepdown.  Full code.  Aadhav Uhlig A 03/05/2015, 9:42 PM

## 2015-03-05 NOTE — ED Notes (Signed)
Notified dr. Fayrene Fearing of cp

## 2015-03-05 NOTE — Progress Notes (Addendum)
Pt insulin pump returned to her per Dr Onalee Hua.  PT is awake and oriented x4  Insulin drip

## 2015-03-05 NOTE — ED Notes (Signed)
Pt is diabetic - started vomiting this am - has an insulin pump and has made adjustments but BS keeps going up >400 last time she checked it

## 2015-03-05 NOTE — ED Notes (Signed)
Pt assisted on bedpan by nurse tech

## 2015-03-06 ENCOUNTER — Observation Stay (HOSPITAL_BASED_OUTPATIENT_CLINIC_OR_DEPARTMENT_OTHER): Payer: BC Managed Care – PPO

## 2015-03-06 DIAGNOSIS — Z794 Long term (current) use of insulin: Secondary | ICD-10-CM | POA: Diagnosis not present

## 2015-03-06 DIAGNOSIS — E1010 Type 1 diabetes mellitus with ketoacidosis without coma: Secondary | ICD-10-CM

## 2015-03-06 DIAGNOSIS — E875 Hyperkalemia: Secondary | ICD-10-CM | POA: Diagnosis present

## 2015-03-06 DIAGNOSIS — T85694A Other mechanical complication of insulin pump, initial encounter: Secondary | ICD-10-CM | POA: Diagnosis present

## 2015-03-06 DIAGNOSIS — Z7982 Long term (current) use of aspirin: Secondary | ICD-10-CM | POA: Diagnosis not present

## 2015-03-06 DIAGNOSIS — I10 Essential (primary) hypertension: Secondary | ICD-10-CM

## 2015-03-06 DIAGNOSIS — R079 Chest pain, unspecified: Secondary | ICD-10-CM

## 2015-03-06 DIAGNOSIS — Z9641 Presence of insulin pump (external) (internal): Secondary | ICD-10-CM | POA: Diagnosis present

## 2015-03-06 DIAGNOSIS — Y848 Other medical procedures as the cause of abnormal reaction of the patient, or of later complication, without mention of misadventure at the time of the procedure: Secondary | ICD-10-CM | POA: Diagnosis present

## 2015-03-06 DIAGNOSIS — R739 Hyperglycemia, unspecified: Secondary | ICD-10-CM | POA: Diagnosis present

## 2015-03-06 DIAGNOSIS — Z9049 Acquired absence of other specified parts of digestive tract: Secondary | ICD-10-CM | POA: Diagnosis present

## 2015-03-06 DIAGNOSIS — R112 Nausea with vomiting, unspecified: Secondary | ICD-10-CM | POA: Diagnosis not present

## 2015-03-06 LAB — BASIC METABOLIC PANEL
ANION GAP: 14 (ref 5–15)
ANION GAP: 6 (ref 5–15)
Anion gap: 16 — ABNORMAL HIGH (ref 5–15)
Anion gap: 12 (ref 5–15)
Anion gap: 10 (ref 5–15)
Anion gap: 6 (ref 5–15)
BUN: 24 mg/dL — ABNORMAL HIGH (ref 6–20)
BUN: 24 mg/dL — ABNORMAL HIGH (ref 6–20)
BUN: 24 mg/dL — AB (ref 6–20)
BUN: 23 mg/dL — ABNORMAL HIGH (ref 6–20)
BUN: 21 mg/dL — AB (ref 6–20)
BUN: 19 mg/dL (ref 6–20)
CALCIUM: 8.3 mg/dL — AB (ref 8.9–10.3)
CHLORIDE: 113 mmol/L — AB (ref 101–111)
CHLORIDE: 111 mmol/L (ref 101–111)
CO2: 12 mmol/L — ABNORMAL LOW (ref 22–32)
CO2: 15 mmol/L — AB (ref 22–32)
CO2: 15 mmol/L — ABNORMAL LOW (ref 22–32)
CO2: 15 mmol/L — AB (ref 22–32)
CO2: 21 mmol/L — ABNORMAL LOW (ref 22–32)
CO2: 19 mmol/L — AB (ref 22–32)
CREATININE: 0.91 mg/dL (ref 0.44–1.00)
CREATININE: 0.95 mg/dL (ref 0.44–1.00)
CREATININE: 0.68 mg/dL (ref 0.44–1.00)
Calcium: 7.9 mg/dL — ABNORMAL LOW (ref 8.9–10.3)
Calcium: 8.2 mg/dL — ABNORMAL LOW (ref 8.9–10.3)
Calcium: 8.1 mg/dL — ABNORMAL LOW (ref 8.9–10.3)
Calcium: 8.2 mg/dL — ABNORMAL LOW (ref 8.9–10.3)
Calcium: 7.9 mg/dL — ABNORMAL LOW (ref 8.9–10.3)
Chloride: 108 mmol/L (ref 101–111)
Chloride: 110 mmol/L (ref 101–111)
Chloride: 112 mmol/L — ABNORMAL HIGH (ref 101–111)
Chloride: 111 mmol/L (ref 101–111)
Creatinine, Ser: 1.03 mg/dL — ABNORMAL HIGH (ref 0.44–1.00)
Creatinine, Ser: 0.98 mg/dL (ref 0.44–1.00)
Creatinine, Ser: 0.84 mg/dL (ref 0.44–1.00)
GFR calc Af Amer: >60 mL/min (ref >60)
GFR calc Af Amer: >60 mL/min (ref >60)
GFR calc Af Amer: >60 mL/min (ref >60)
GFR calc Af Amer: >60 mL/min (ref >60)
GFR calc non Af Amer: >60 mL/min (ref >60)
GFR calc non Af Amer: >60 mL/min (ref >60)
GFR, EST NON AFRICAN AMERICAN: 59 mL/min — AB (ref >60)
GLUCOSE: 284 mg/dL — AB (ref 65–99)
Glucose, Bld: 412 mg/dL — ABNORMAL HIGH (ref 65–99)
Glucose, Bld: 282 mg/dL — ABNORMAL HIGH (ref 65–99)
Glucose, Bld: 316 mg/dL — ABNORMAL HIGH (ref 65–99)
Glucose, Bld: 196 mg/dL — ABNORMAL HIGH (ref 65–99)
Glucose, Bld: 109 mg/dL — ABNORMAL HIGH (ref 65–99)
POTASSIUM: 4.4 mmol/L (ref 3.5–5.1)
Potassium: 5.4 mmol/L — ABNORMAL HIGH (ref 3.5–5.1)
Potassium: 3.8 mmol/L (ref 3.5–5.1)
Potassium: 4 mmol/L (ref 3.5–5.1)
Potassium: 3.7 mmol/L (ref 3.5–5.1)
Potassium: 3.5 mmol/L (ref 3.5–5.1)
SODIUM: 140 mmol/L (ref 135–145)
SODIUM: 139 mmol/L (ref 135–145)
SODIUM: 137 mmol/L (ref 135–145)
Sodium: 136 mmol/L (ref 135–145)
Sodium: 138 mmol/L (ref 135–145)
Sodium: 136 mmol/L (ref 135–145)

## 2015-03-06 LAB — GLUCOSE, CAPILLARY
GLUCOSE-CAPILLARY: 133 mg/dL — AB (ref 65–99)
GLUCOSE-CAPILLARY: 184 mg/dL — AB (ref 65–99)
GLUCOSE-CAPILLARY: 190 mg/dL — AB (ref 65–99)
GLUCOSE-CAPILLARY: 199 mg/dL — AB (ref 65–99)
GLUCOSE-CAPILLARY: 205 mg/dL — AB (ref 65–99)
GLUCOSE-CAPILLARY: 261 mg/dL — AB (ref 65–99)
GLUCOSE-CAPILLARY: 283 mg/dL — AB (ref 65–99)
GLUCOSE-CAPILLARY: 336 mg/dL — AB (ref 65–99)
GLUCOSE-CAPILLARY: 351 mg/dL — AB (ref 65–99)
GLUCOSE-CAPILLARY: 370 mg/dL — AB (ref 65–99)
GLUCOSE-CAPILLARY: 380 mg/dL — AB (ref 65–99)
GLUCOSE-CAPILLARY: 97 mg/dL (ref 65–99)
Glucose-Capillary: 382 mg/dL — ABNORMAL HIGH (ref 65–99)
Glucose-Capillary: 362 mg/dL — ABNORMAL HIGH (ref 65–99)
Glucose-Capillary: 232 mg/dL — ABNORMAL HIGH (ref 65–99)
Glucose-Capillary: 260 mg/dL — ABNORMAL HIGH (ref 65–99)
Glucose-Capillary: 339 mg/dL — ABNORMAL HIGH (ref 65–99)
Glucose-Capillary: 305 mg/dL — ABNORMAL HIGH (ref 65–99)
Glucose-Capillary: 220 mg/dL — ABNORMAL HIGH (ref 65–99)
Glucose-Capillary: 131 mg/dL — ABNORMAL HIGH (ref 65–99)
Glucose-Capillary: 69 mg/dL (ref 65–99)

## 2015-03-06 LAB — TROPONIN I
Troponin I: 0.05 ng/mL — ABNORMAL HIGH (ref <0.031)
Troponin I: 0.2 ng/mL — ABNORMAL HIGH (ref <0.031)

## 2015-03-06 LAB — MRSA PCR SCREENING: MRSA BY PCR: NEGATIVE

## 2015-03-06 MED ORDER — INSULIN ASPART 100 UNIT/ML ~~LOC~~ SOLN
10.0000 [IU] | Freq: Once | SUBCUTANEOUS | Status: AC
  Administered 2015-03-06: 10 [IU] via SUBCUTANEOUS

## 2015-03-06 MED ORDER — SODIUM CHLORIDE 0.45 % IV SOLN
INTRAVENOUS | Status: DC
  Administered 2015-03-06: 11:00:00 via INTRAVENOUS

## 2015-03-06 MED ORDER — SODIUM CHLORIDE 0.9 % IV BOLUS (SEPSIS)
500.0000 mL | Freq: Once | INTRAVENOUS | Status: AC
  Administered 2015-03-06: 500 mL via INTRAVENOUS

## 2015-03-06 MED ORDER — ONDANSETRON HCL 4 MG/2ML IJ SOLN
4.0000 mg | Freq: Four times a day (QID) | INTRAMUSCULAR | Status: DC | PRN
  Administered 2015-03-06: 4 mg via INTRAVENOUS
  Filled 2015-03-06: qty 2

## 2015-03-06 MED ORDER — MENTHOL 3 MG MT LOZG
1.0000 | LOZENGE | OROMUCOSAL | Status: DC | PRN
  Administered 2015-03-06: 3 mg via ORAL
  Filled 2015-03-06 (×3): qty 9

## 2015-03-06 MED ORDER — DEXTROSE 50 % IV SOLN
INTRAVENOUS | Status: AC
  Administered 2015-03-06: 12 mL
  Filled 2015-03-06: qty 50

## 2015-03-06 MED ORDER — ONDANSETRON HCL 4 MG/2ML IJ SOLN
INTRAMUSCULAR | Status: AC
  Administered 2015-03-06: 4 mg
  Filled 2015-03-06: qty 2

## 2015-03-06 MED ORDER — INSULIN ASPART 100 UNIT/ML ~~LOC~~ SOLN
SUBCUTANEOUS | Status: AC
  Filled 2015-03-06: qty 1

## 2015-03-06 NOTE — Progress Notes (Signed)
Pt had 2 bouts of N&V that were reported to Hospitalist and treated with medication and resolved. No further

## 2015-03-06 NOTE — Progress Notes (Signed)
Paged by RN caring for this patient.  Per RN, patient admitted with DKA yesterday.  Was transitioned off IV insulin drip early this morning (~7am) and told to resume her insulin pump.  RN not sure whether patient used brand new insulin pump supplies when she resumed her pump (per [facility] insulin pump policy, patient needs to use all new insulin pump supplies and new insulin when resuming insulin pump after admission for DKA).  Asked RN to please speak with patient and inform patient that she (the patient) will need to change out her insulin pump supplies this AM if she did not use new supplies when she resumed her pump earlier this AM.  Concern that patient may be going back into DKA?  CO2 down to 15 and Anion Gap 12 on 6:30 AM BMET.  RN stated patient did not have any physical symptoms of DKA at this point.  Will have DM Coordinator see patient today to assess insulin pump.  Note Dr. Kerry Hough spoke with patient's Endocrinologist this AM (Dr. Talmage Coin).  Will also have DM Coordinator on [facility] speak with Dr. Kerry Hough to review findings.  Also asked RN to please continue to check CBGs frequently until DM Coordinator can see pt today.  Will follow Ambrose Finland RN, MSN, CDE Diabetes Coordinator Inpatient Glycemic Control Team Team Pager: [phone] (8a-5p)

## 2015-03-06 NOTE — Progress Notes (Signed)
[facility] PROGRESS NOTE  Helen [name] WRU:[id number] DOB: [birth year] DOA: 03/05/2015 PCP: Leo Grosser, MD  Endocrinologist: Dr. Ester Rink  Assessment/Plan: 1. DKA. Anion gap is currently closed with glucose trending down. Pt has a  Hx of DM Type 1. Consulted with Endocrinologist, Dr. Ester Rink, on 8/9. Will ask DM coordinator to speak with her concerning her pump and proper DM control and settings. It is possible she is not getting enough basal/blous insulin through her pump. On her last endocrinology visit, A1C was reported to be around 9. Will try to adjust pump settings for better glucose control. If blood sugar remains uncontrolled with insulin pump, will consider transitioning to Lantus/Novolog. Continue aggressive IV hydration. She is still NPO. Will need to address insulin pump settings prior to starting diet. Currently she has basal rate on pump running.  2. Nausea and vomiting, secondary to DKA. Currently resolved through IVF, Zofran, and insulin.  3. Chest pain. Possibly GI related. ACS ruled out due to negative repeat troponin and EKG. 4. HTN. Blood pressure stable continue Lisinopril. 5. Hyperkalemia. Related to DKA, resolved with IVF and insulin.   Code Status: Full DVT prophylaxis: SCDs  Family Communication: Spoke with patient. She understands care plan and has no concerns at this time. Disposition Plan: Discharge home once symptoms improve.    Consultants:  DM Coordinator  Endocrinologist: Dr. Ester Rink, telephone consult  Procedures:    Antibiotics:    HPI/Subjective: Yesterday she woke up with glucose of 397, her baseline is normally 93-98 int he mornings. After taking corrections on her pump and taking insulin she states her glucose increased to 400s. She began vomiting and came directly to the ED for treatment, since being admitted she reports symptoms have improved. She denies the possibilities of insulin being expired. She denies eating out of her  norm the night prior. No reports of shortness of breath, chest pain, dysuria or abd pain at this time.   Objective: Filed Vitals:   03/06/15 0837  BP:   Pulse:   Temp: 98.9 F (37.2 C)  Resp:     Intake/Output Summary (Last 24 hours) at 03/06/15 0901 Last data filed at 03/06/15 0300  Gross per 24 hour  Intake   2100 ml  Output    600 ml  Net   1500 ml   Filed Weights   03/05/15 1453 03/05/15 2206 03/06/15 0400  Weight: 83.915 kg (185 lb) 87.6 kg (193 lb 2 oz) 87.6 kg (193 lb 2 oz)    Exam: General:  Appears calm and comfortable laying bed Cardiovascular: tachycardic, but regular rhythm. No m/r/g.  Telemetry: tachycardic Respiratory: CTA bilaterally, no w/r/r. Normal respiratory effort. Abdomen: soft, ntnd Skin: no rash or induration  Musculoskeletal: grossly normal tone BUE/BLE. No LE edema. Psychiatric: grossly normal mood and affect, speech fluent and appropriate Neurologic: grossly non-focal.  Data Reviewed: Basic Metabolic Panel:  Recent Labs Lab 03/05/15 1543 03/05/15 2006 03/05/15 2237 03/06/15 0300 03/06/15 0638  NA 134* 140 138 136 140  K 5.3* 4.5 4.0 5.4* 3.8  CL 98* 112* 111 108 113*  CO2 17* 17* 17* 12* 15*  GLUCOSE 505* 235* 177* 412* 282*  BUN 26* 22* 21* 24* 24*  CREATININE 1.04* 0.83 0.77 0.91 1.03*  CALCIUM 9.4 8.1* 7.9* 7.9* 8.3*   Liver Function Tests:  Recent Labs Lab 03/05/15 2237  AST 25  ALT 43  ALKPHOS 136*  BILITOT 1.4*  PROT 6.7  ALBUMIN 3.2*    Recent Labs Lab  03/05/15 2237  LIPASE 10*   CBC:  Recent Labs Lab 03/05/15 1543  WBC 9.4  HGB 13.0  HCT 38.9  MCV 89.4  PLT 277   Cardiac Enzymes:  Recent Labs Lab 03/05/15 1543 03/05/15 2237 03/06/15 0300  TROPONINI <0.03 <0.03 <0.03    CBG:  Recent Labs Lab 03/06/15 0341 03/06/15 0444 03/06/15 0548 03/06/15 0649 03/06/15 0831  GLUCAP 362* 351* 283* 232* 190*    Recent Results (from the past 240 hour(s))  MRSA PCR Screening     Status: None    Collection Time: 03/05/15 10:46 PM  Result Value Ref Range Status   MRSA by PCR NEGATIVE NEGATIVE Final    Comment:        The GeneXpert MRSA Assay (FDA approved for NASAL specimens only), is one component of a comprehensive MRSA colonization surveillance program. It is not intended to diagnose MRSA infection nor to guide or monitor treatment for MRSA infections.      Studies: No results found.  Scheduled Meds: . aspirin EC  81 mg Oral QPM  . cholecalciferol  1,000 Units Oral q morning - 10a  . lisinopril  5 mg Oral QPM   Continuous Infusions: . sodium chloride 125 mL/hr at 03/05/15 2307  . dextrose 5 % and 0.45% NaCl 100 mL/hr at 03/05/15 2309  . insulin (NOVOLIN-R) infusion Stopped (03/05/15 2230)    Principal Problem:   DKA (diabetic ketoacidoses) Active Problems:   Diabetes mellitus type I   Nausea and vomiting   Chest pain   Hyperglycemia   Essential hypertension    Time spent: 30 minutes    Erick Blinks, MD  [facility] Pager [phone]. If 7PM-7AM, please contact night-coverage at [email or web address], password [facility] 03/06/2015, 9:01 AM      I, Princella Pellegrini. Jari Pigg, acting as scribe, recorded this note contemporaneously in the presence of Dr. Erick Blinks, M.D. on 03/06/2015.  Attending:  I have reviewed the above documentation for accuracy and completeness, and I agree with the above.  Zaira Iacovelli

## 2015-03-06 NOTE — Progress Notes (Signed)
Inpatient Diabetes Program Recommendations  AACE/ADA: New Consensus Statement on Inpatient Glycemic Control (2013)  Target Ranges:  Prepandial:   less than 140 mg/dL      Peak postprandial:   less than 180 mg/dL (1-2 hours)      Critically ill patients:  140 - 180 mg/dL   Results for Helen [name], [name] (MRN [id number]) as of 03/06/2015 12:15  Ref. Range 03/05/2015 22:35 03/05/2015 23:28 03/06/2015 00:34 03/06/2015 01:37 03/06/2015 02:30 03/06/2015 03:00 03/06/2015 03:41 03/06/2015 04:44 03/06/2015 05:48 03/06/2015 06:49 03/06/2015 08:31 03/06/2015 10:38 03/06/2015 11:58  Glucose-Capillary Latest Ref Range: 65-99 mg/dL 409 (H) 811 (H) 914 (H) 370 (H) 380 (H) 382 (H) 362 (H) 351 (H) 283 (H) 232 (H) 190 (H) 260 (H) 336 (H)   Outpatient Diabetes medications: Medtronic insulin pump with Novolog insulin Current orders for Inpatient glycemic control: insulin pump order set  Inpatient Diabetes Program Recommendations Insulin - IV drip/GlucoStabilizer: Recommend having patient remove insulin pump and resume IV insulin drip per DKA protocol. Please continue IV insulin per DKA protocol until acidosis is corrected as determined by MD (Venous CO2=20, normal anion gap (8-12), negative ketones).   Note: Patient admitted with DKA and initially placed on IV insulin drip per DKA protocol and then transitioned back to insulin pump around 10:30 pm last night. In reviewing the chart, acidosis was not cleared at the time of transition and patient's glucose persistently continued to increase after insulin pump was resumed. Talked with patient and she reports that she is followed by Dr. Sharl Ma and she uses a Medtronic insulin pump with Novolog insulin which is currently attached and on. Patient states that she changed her insulin pump infusion site on the evening of 03/04/2015 and her glucose was running high on 03/05/2015 despite correction done with her insulin pump and she began to have nausea and vomiting. Patient states that she last saw Dr. Sharl Ma about  2 weeks ago and she reports that her glucose usually trends well in the morning and tends to get up in the 200-300's mg/dl in the afternoon. Patient reports that Dr. Sharl Ma had asked her to check her glucose more often and to keep a log which she was instructed to bring with her to her next appointment on March 29, 2015 so Dr. Sharl Ma could make insulin pump adjustments if needed.   Reviewed insulin pump settings which are as follows: Basal rates 12A 1.3 units/hour 2A 1.0 unit/hour 6A 1.3 units/hour 8A 1.2 units/hour 4P 1.3 units/hour Total Basal insulin: 29.2 units/24 hours  Insulin to Carb Ratio 1:4 (1 unit for 4 grams of carbohydrates) Insulin Sensitivity 1:24 (1 unit drops glucose 24 mg/dl) Target glucose 782 mg/dl  Inquired about changing out infusion site, tubing, and insulin reservoir at time of transition back to insulin pump and patient reports that she did not have any insulin supplies with her here at the hospital so she connected to her same infusion site she had at time of admission which was inserted on 03/04/15. Explained to patient that there could be an issue with her infusion site especially since her glucose went right back up once she resumed the insulin pump. Discussed DKA pathophysiology and hospital protocol for DKA treatment. Explained to the patient that since lab results indicate that she is still acidotic and glucose is elevated, it is recommended that she be placed back on IV insulin per DKA protocol. Patient states that she understands and would like to do whatever is necessary to improve glycemic control. Mysti, RN paged  Dr. Kerry Hough to inform of recommendations and Dr. Kerry Hough has ordered to resume IV insulin per DKA protocol. Had patient remove insulin pump as well as insulin infusion site. When patient removed insulin infusion site, the catheter on the site was kinked. Therefore, kinked catheter likely cause of DKA. However, did ask patient to call 1-800 number on the back of  her insulin pump and have them run a few tests with the pump to ensure it is working properly. Explained to the patient that if everything is okay with the insulin pump, once the acidosis is cleared she would resume her insulin pump after she inserts a new infusion catheter, new tubing, and new insulin reservior with Novolog. Patient is going to have her family bring her insulin pump supplies to the hospital so they will be available if she is allowed to resume her insulin pump. Inquired about patient's ability to make insulin pump changes and patient states that she does not ever make any changes with the insulin pump settings, Dr. Sharl Ma makes all the changes that need to be done with her insulin pump. Explained that she may need to have some adjustments with the pump settings since her glucose is running high in the afternoon. Informed patient that Dr. Sharl Ma would have to be the doctor to determine what changes need to be made with her insulin pump settings. Patient states that she has an upcoming appointment with Dr. Sharl Ma on March 29, 2015 and she plans to talk with him further about making some adjustments. Encouraged patient to consider calling Dr. Sharl Ma and inquiring about moving appointment up so she can see him sooner. Patient verbalized understanding of information discussed and she states that she has no further questions at this time related to diabetes.  Once acidosis is corrected as evidenced by Venous CO2=20, normal anion gap (8-12), negative ketones, would recommend allowing patient to resume her insulin pump after she inserts a new infusion set, tubing, and insulin reservior with Novolog insulin. Anticipate DKA resulted from kinked insulin pump infusion site. If insulin pump is resumed, please continue insulin drip for one hour after insulin pump is connected to allow an hour overlap then insulin drip can be stopped, glucose checked and a correction can be done with the insulin pump if needed at  that time.   Thanks, Orlando Penner, RN, MSN, CCRN, CDE Diabetes Coordinator Inpatient Diabetes Program [phone] (Team Pager from 8am to 5pm) [phone] ([facility] office) [phone] [facility] office) [phone] [facility] office)

## 2015-03-06 NOTE — Progress Notes (Signed)
Paged Dr Onalee Hua due to continued high CBG Orders received  Completed

## 2015-03-06 NOTE — Progress Notes (Signed)
Present with patient for support. 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 Listening presence and affirmation. Prayed with her. Will continue to follow if possible.

## 2015-03-06 NOTE — Progress Notes (Signed)
Pts CBG @ 1030 was 260 and BMET indicated that pt was still in DKA. Insulin gtt had been stopped during the night and pt was advised to reconnect her insulin pump by the MD. Dr Kerry Hough was paged to notify him of rising CBG and BMET values. Orders received to start pt on insulin gtt again. Diabetic coordinator came to talk to pt and had the pt remove the pump and the site before the drip was restarted. On removal it was noted that the catheter was bent. RN advised by MD and diabetes coordinator to monitor CBGs qh and BMETs q4h until acidosis is completely resolved; also to allow one hour overlap between restarting pts pump and discontinuing the insulin gtt. The pt is to restart her pump at a new site with all new supplies. Pt educated and understanding verbalized.

## 2015-03-06 NOTE — Progress Notes (Signed)
Paged Dr Onalee Hua due to increases in pt CBG Orders received  No further

## 2015-03-06 NOTE — Progress Notes (Signed)
2139 cbg =69  Pt given 12cc D50 per insulin drip protocol plus peanut butter crackers. @ 2159 cbg=97 Maren Reamer PA updated

## 2015-03-06 NOTE — Progress Notes (Signed)
Dr Onalee Hua updated on pt condition &1510 CO=15 =21 =19 Gap has been closed  Will continue to monitor with next BMP. Pt to remain on stabilizer until able to transition to pt's own insulin pump.

## 2015-03-07 LAB — GLUCOSE, CAPILLARY
GLUCOSE-CAPILLARY: 139 mg/dL — AB (ref 65–99)
GLUCOSE-CAPILLARY: 201 mg/dL — AB (ref 65–99)
GLUCOSE-CAPILLARY: 189 mg/dL — AB (ref 65–99)
GLUCOSE-CAPILLARY: 136 mg/dL — AB (ref 65–99)
GLUCOSE-CAPILLARY: 151 mg/dL — AB (ref 65–99)
GLUCOSE-CAPILLARY: 151 mg/dL — AB (ref 65–99)
GLUCOSE-CAPILLARY: 91 mg/dL (ref 65–99)
GLUCOSE-CAPILLARY: 58 mg/dL — AB (ref 65–99)
Glucose-Capillary: 129 mg/dL — ABNORMAL HIGH (ref 65–99)
Glucose-Capillary: 131 mg/dL — ABNORMAL HIGH (ref 65–99)
Glucose-Capillary: 135 mg/dL — ABNORMAL HIGH (ref 65–99)
Glucose-Capillary: 150 mg/dL — ABNORMAL HIGH (ref 65–99)
Glucose-Capillary: 169 mg/dL — ABNORMAL HIGH (ref 65–99)
Glucose-Capillary: 172 mg/dL — ABNORMAL HIGH (ref 65–99)
Glucose-Capillary: 175 mg/dL — ABNORMAL HIGH (ref 65–99)
Glucose-Capillary: 177 mg/dL — ABNORMAL HIGH (ref 65–99)
Glucose-Capillary: 239 mg/dL — ABNORMAL HIGH (ref 65–99)
Glucose-Capillary: 51 mg/dL — ABNORMAL LOW (ref 65–99)
Glucose-Capillary: 87 mg/dL (ref 65–99)

## 2015-03-07 LAB — BASIC METABOLIC PANEL
ANION GAP: 6 (ref 5–15)
Anion gap: 8 (ref 5–15)
BUN: 19 mg/dL (ref 6–20)
BUN: 17 mg/dL (ref 6–20)
CALCIUM: 8.3 mg/dL — AB (ref 8.9–10.3)
CHLORIDE: 111 mmol/L (ref 101–111)
CO2: 21 mmol/L — AB (ref 22–32)
CO2: 21 mmol/L — AB (ref 22–32)
Calcium: 8.1 mg/dL — ABNORMAL LOW (ref 8.9–10.3)
Chloride: 111 mmol/L (ref 101–111)
Creatinine, Ser: 0.7 mg/dL (ref 0.44–1.00)
Creatinine, Ser: 0.71 mg/dL (ref 0.44–1.00)
GFR calc Af Amer: >60 mL/min (ref >60)
GFR calc Af Amer: >60 mL/min (ref >60)
GFR calc non Af Amer: >60 mL/min (ref >60)
GFR calc non Af Amer: >60 mL/min (ref >60)
GLUCOSE: 155 mg/dL — AB (ref 65–99)
Glucose, Bld: 137 mg/dL — ABNORMAL HIGH (ref 65–99)
POTASSIUM: 3.4 mmol/L — AB (ref 3.5–5.1)
Potassium: 3.5 mmol/L (ref 3.5–5.1)
SODIUM: 140 mmol/L (ref 135–145)
Sodium: 138 mmol/L (ref 135–145)

## 2015-03-07 MED ORDER — POTASSIUM CHLORIDE CRYS ER 20 MEQ PO TBCR
40.0000 meq | EXTENDED_RELEASE_TABLET | Freq: Once | ORAL | Status: AC
  Administered 2015-03-07: 40 meq via ORAL
  Filled 2015-03-07: qty 2

## 2015-03-07 MED ORDER — INSULIN PUMP
SUBCUTANEOUS | Status: DC
  Administered 2015-03-07 (×3): via SUBCUTANEOUS
  Filled 2015-03-07: qty 1

## 2015-03-07 MED ORDER — INSULIN PUMP: SUBCUTANEOUS | Status: AC

## 2015-03-07 MED FILL — Insulin Aspart Inj 100 Unit/ML: SUBCUTANEOUS | Qty: 10 | Status: AC

## 2015-03-07 NOTE — Plan of Care (Signed)
Problem: Phase I Progression Outcomes Goal: NPO or per MD order Outcome: Progressing Pt is on carb-mod diet Goal: OOB as tolerated unless otherwise ordered Outcome: Progressing Pt up to [facility] PRN with Minimal assist

## 2015-03-07 NOTE — Discharge Summary (Signed)
Physician Discharge Summary  Helen [name]:[id number] DOB: 09-Nov-1955 DOA: 03/05/2015  PCP: Leo Grosser, MD  Admit date: 03/05/2015 Discharge date: 03/07/2015  Time spent: 35 minutes  Recommendations for Outpatient Follow-up:  1. Follow-up with Endocrinology as outpatient. Pt to schedule the appointment.    Discharge Diagnoses:  Principal Problem:   DKA (diabetic ketoacidoses) Active Problems:   Diabetes mellitus type I   Nausea and vomiting   Chest pain   Hyperglycemia   Essential hypertension   Discharge Condition: Improved   Diet recommendation: carb modified   Filed Weights   03/05/15 2206 03/06/15 0400 03/07/15 0500  Weight: 87.6 kg (193 lb 2 oz) 87.6 kg (193 lb 2 oz) 88 kg (194 lb 0.1 oz)    History of present illness:  59 yo female h/o DM1 for over 20 years comes in with one day of vomiting over 3 times and epigastic/chest pain that lasted over 3 hours today while she was vomiting that resolved with antiemetics in the ED. Pt reports that her glucose dropped several times on 8/7 into the 60s with her insulin pump. She ate an apple fritter and fish sandwich, and when she woke up on 8/8, her glucose was over 300, which is very unusual for her. She bolused herself extra insulin; went to work later for lunch her glucose was over 400. By this time she had vomited several times and had not eaten.She then decided to come to ED as she was scared something was wrong. She denied any fevers on ED triage and her abd pain had resolved.The pain was in the epigrastric area and radiated up into the chest. The pain was persistent for over 3 hours at onset.  Hospital Course:  Pt was admitted to the hospital for DKA. Upon admission her Anion gap was 19 and CBG was 444. Pt was placed on insulin drip with improvement and was transitioned back to her insulin pump on 8/10 at a new site with all new supplies; will overlap with IV insulin. Monitoring of CBGs qh and BMETs q4h were  done until acidosis was completely resolved. Anion gap is currently closed, with glucose continuing downward trend upon discharge. Pt has a Hx of DM Type 1. Consulted with Endocrinologist, Dr. Ester Rink, on 8/9. On her last endocrinology visit, A1C was reported to be around 9. DM Coordinator saw pt on 03/06/15. Etiology of DKA was likely due to pump tubing, which was found to be kinked when removed yesterday.  She was able to tolerate a solid diet prior to d/c. Insulin pump was reinserted with new tubing and she was restarted on home insulin pump settings. Her blood sugars have remained reasonably controlled. She has been advised to follow up with her endocrinologist later this week   Nausea and vomiting, secondary to DKA. Currently resolved through IVF, Zofran, and insulin.   Chest pain. Resolved. Possibly GI related. ACS ruled out with negative repeat troponins and EKG. Now resolved.  HTN. Blood pressure stable. Will continue Lisinopril.  Hyperkalemia. Related to DKA, resolved with IVF and insulin.  Procedures:  None  Consultations:  Endocrinology   Discharge Exam: Filed Vitals:   03/07/15 0900  BP:   Pulse:   Temp: 98.4 F (36.9 C)  Resp:     General:  Appears comfortable, calm. Cardiovascular: Regular rate and rhythm, no murmur, rub or gallop. No lower extremity edema. Telemetry: Sinus rhythm, no arrhythmias  Respiratory: Clear to auscultation bilaterally, no wheezes, rales or rhonchi. Normal respiratory effort. Abdomen:  soft, ntnd Skin: no rash or induration  Musculoskeletal: grossly normal tone bilateral upper and lower extremities Psychiatric: grossly normal mood and affect, speech fluent and appropriate Neurologic: grossly non-focal.   Discharge Instructions    Current Discharge Medication List    CONTINUE these medications which have NOT CHANGED   Details  aspirin EC 81 MG tablet Take 81 mg by mouth every evening.    cholecalciferol (VITAMIN D) 1000 UNITS  tablet Take 1,000 Units by mouth every morning.    lisinopril (PRINIVIL,ZESTRIL) 5 MG tablet Take 5 mg by mouth every evening.    naproxen sodium (ANAPROX) 220 MG tablet Take 440 mg by mouth daily as needed (headaches).    NOVOLOG FLEXPEN 100 UNIT/ML injection 0-55 Units by Pump Prime route continuous. Receives up to 55 units per pump daily       No Known Allergies    The results of significant diagnostics from this hospitalization (including imaging, microbiology, ancillary and laboratory) are listed below for reference.    Significant Diagnostic Studies: No results found.  Microbiology: Recent Results (from the past 240 hour(s))  MRSA PCR Screening     Status: None   Collection Time: 03/05/15 10:46 PM  Result Value Ref Range Status   MRSA by PCR NEGATIVE NEGATIVE Final    Comment:        The GeneXpert MRSA Assay (FDA approved for NASAL specimens only), is one component of a comprehensive MRSA colonization surveillance program. It is not intended to diagnose MRSA infection nor to guide or monitor treatment for MRSA infections.      Labs: Basic Metabolic Panel:  Recent Labs Lab 03/06/15 1417 03/06/15 1850 03/06/15 2207 03/07/15 0214 03/07/15 0644  NA 137 138 136 138 140  K 4.0 3.7 3.5 3.5 3.4*  CL 112* 111 111 111 111  CO2 15* 21* 19* 21* 21*  GLUCOSE 316* 196* 109* 137* 155*  BUN 23* 21* CREATININE 0.98 0.84 0.68 0.70 0.71  CALCIUM 8.1* 8.2* 7.9* 8.1* 8.3*   Liver Function Tests:  Recent Labs Lab 03/05/15 2237  AST 25  ALT 43  ALKPHOS 136*  BILITOT 1.4*  PROT 6.7  ALBUMIN 3.2*    Recent Labs Lab 03/05/15 2237  LIPASE 10*   No results for input(s): AMMONIA in the last 168 hours. CBC:  Recent Labs Lab 03/05/15 1543  WBC 9.4  HGB 13.0  HCT 38.9  MCV 89.4  PLT 277   Cardiac Enzymes:  Recent Labs Lab 03/05/15 1543 03/05/15 2237 03/06/15 0300 03/06/15 0647 03/06/15 1026  TROPONINI <0.03 <0.03 <0.03 0.05* 0.20*    BNP: BNP (last 3 results) No results for input(s): BNP in the last 8760 hours.  ProBNP (last 3 results) No results for input(s): PROBNP in the last 8760 hours.  CBG:  Recent Labs Lab 03/07/15 0453 03/07/15 0553 03/07/15 0644 03/07/15 0746 03/07/15 0850  GLUCAP 189* 136* 151* 135* 175*       Signed:  Erick Blinks, M.D. [facility] 03/07/2015, 9:29 AM    I, Norg'e Tisdol, acting a scribe, recorded this note contemporaneously in the presence of Dr. Erick Blinks, M.D. on 03/07/2015 at 9:29 AM  Attending:  I have reviewed the above documentation for accuracy and completeness, and I agree with the above.  MEMON,JEHANZEB

## 2015-03-07 NOTE — Progress Notes (Signed)
Pt is to be discharged today. Pt is in NAD, IV is out, all paperwork has been reviewed/discussed with patient, and there are no questions/concerns at this time. Assessment is unchanged from this morning. Pt is to be accompanied downstairs by staff and family via wheelchair.  

## 2015-03-07 NOTE — Progress Notes (Signed)
Pt re-transitioned to personal insulin pump. Contract has been explained to patient with all questions or concerns answered. Pt has signed contract and demonstrated ability to manage pump. She has flow sheet by bedside and agrees to keep accurate documentation. Will continue insulin drip over next two hours with close monitoring of pt's CBG. As long as CBGs remain stable, will turn insulin drip off once two hours have elapsed.

## 2015-03-07 NOTE — Progress Notes (Signed)
Dr Onalee Hua updated on pt 39 C02=21 with cbg's within < or =140-180 Pt to remain on stabilizer

## 2015-03-07 NOTE — Care Management Note (Signed)
Case Management Note  Patient Details  Name: Helen [name] MRN: [id number] Date of Birth: [birth year]  Expected Discharge Date:                  Expected Discharge Plan:  Home/Self Care  In-House Referral:  NA  Discharge planning Services  CM Consult  Post Acute Care Choice:  NA Choice offered to:  NA  DME Arranged:    DME Agency:     HH Arranged:    HH Agency:     Status of Service:  Completed, signed off  Medicare Important Message Given:    Date Medicare IM Given:    Medicare IM give by:    Date Additional Medicare IM Given:    Additional Medicare Important Message give by:     If discussed at Long Length of Stay Meetings, dates discussed:    Additional Comments: Pt from home, independent at baseline. Admitted with DKA. Pt discharging home today with self care. No CM needs.  Malcolm Metro, RN 03/07/2015, 4:04 PM

## 2015-03-07 NOTE — Discharge Summary (Signed)
   March 07, 2015  Patient: GRACI HULCE  Date of Birth: [birth year]  Date of Visit: 03/05/2015    To Whom It May Concern:  [place] Laduke was seen and admitted to our hospital on 03/05/2015. MAKILA COLOMBE can return to work after 03/09/15.  Sincerely,    Sammuel Bailiff, RN III

## 2015-03-07 NOTE — Progress Notes (Signed)
[facility] PROGRESS NOTE  Helen [name] WGN:[id number] DOB: 1956-01-23 DOA: 03/05/2015 PCP: Leo Grosser, MD  Endocrinologist: Dr. Ester Rink  Assessment/Plan: 1. DKA. Anion gap is currently closed with glucose continuing downward trend. Pt has a  Hx of DM Type 1. Consulted with Endocrinologist, Dr. Ester Rink, on 8/9. DM Coordinator saw pt on 03/06/15 and recommended resuming IV insulin drip per DKA protocol with monitoring of CBGs qh and BMETs q4h until acidosis completely resolved. Pt was restarted on insulin drip, her anion gap is close, and blood sugars are reasonably controlled. Will restart her insulin pump at a new site with all new supplies today. Will overlap with IV insulin. Pump was removed yesterday and tubing was found to be kinked, which was likely contributing to high blood sugars. On her last endocrinology visit, A1C was reported to be around 9. Will try to adjust pump settings for better glucose control. If blood sugar remains uncontrolled with insulin pump, will consider transitioning to Lantus/Novolog. Continue aggressive IV hydration. She is tolerated a solid diet.  2. Nausea and vomiting, secondary to DKA. Currently resolved through IVF, Zofran, and insulin.  3. Chest pain. Resolved. Possibly GI related. ACS ruled out due to negative repeat troponin and EKG. 4. HTN. Blood pressure stable. Will continue Lisinopril. 5. Hyperkalemia. Related to DKA, resolved with IVF and insulin.   Code Status: Full DVT prophylaxis: SCDs  Family Communication: Spoke with patient. She understands care plan and has no concerns at this time. Disposition Plan: Discharge home once symptoms improve.    Consultants:  DM Coordinator  Endocrinologist: Dr. Ester Rink, telephone consult  Procedures:    Antibiotics:    HPI/Subjective: Pt reports feeling better and she has been able to eat a little breakfast this morning. Pt only has c/o mild sore throat. No reported chest pain, SOB,  difficulty with urination, vomiting or nausea.    Objective: Filed Vitals:   03/07/15 0600  BP: 125/68  Pulse: 98  Temp:   Resp: 16    Intake/Output Summary (Last 24 hours) at 03/07/15 0741 Last data filed at 03/07/15 0600  Gross per 24 hour  Intake 3166.98 ml  Output    601 ml  Net 2565.98 ml   Filed Weights   03/05/15 2206 03/06/15 0400 03/07/15 0500  Weight: 87.6 kg (193 lb 2 oz) 87.6 kg (193 lb 2 oz) 88 kg (194 lb 0.1 oz)    Exam: General:  Appears calm and comfortable. Cardiovascular: regular rate and rhythm. No m/r/g.  Telemetry: SR.  Respiratory: clear to auscultation bilaterally, no w/r/r.  Abdomen: soft, no TTP.  Skin: no rash  Musculoskeletal: no LE edema bilaterally. Psychiatric: grossly normal mood and affect. Neurologic: grossly non-focal.  Data Reviewed: Basic Metabolic Panel:  Recent Labs Lab 03/06/15 1417 03/06/15 1850 03/06/15 2207 03/07/15 0214 03/07/15 0644  NA 137 138 136 138 140  K 4.0 3.7 3.5 3.5 3.4*  CL 112* 111 111 111 111  CO2 15* 21* 19* 21* 21*  GLUCOSE 316* 196* 109* 137* 155*  BUN 23* 21* CREATININE 0.98 0.84 0.68 0.70 0.71  CALCIUM 8.1* 8.2* 7.9* 8.1* 8.3*   Liver Function Tests:  Recent Labs Lab 03/05/15 2237  AST 25  ALT 43  ALKPHOS 136*  BILITOT 1.4*  PROT 6.7  ALBUMIN 3.2*    Recent Labs Lab 03/05/15 2237  LIPASE 10*   CBC:  Recent Labs Lab 03/05/15 1543  WBC 9.4  HGB 13.0  HCT 38.9  MCV 89.4  PLT 277   Cardiac Enzymes:  Recent Labs Lab 03/05/15 1543 03/05/15 2237 03/06/15 0300 03/06/15 0647 03/06/15 1026  TROPONINI <0.03 <0.03 <0.03 0.05* 0.20*    CBG:  Recent Labs Lab 03/07/15 0259 03/07/15 0404 03/07/15 0453 03/07/15 0553 03/07/15 0644  GLUCAP 150* 201* 189* 136* 151*    Recent Results (from the past 240 hour(s))  MRSA PCR Screening     Status: None   Collection Time: 03/05/15 10:46 PM  Result Value Ref Range Status   MRSA by PCR NEGATIVE NEGATIVE Final     Comment:        The GeneXpert MRSA Assay (FDA approved for NASAL specimens only), is one component of a comprehensive MRSA colonization surveillance program. It is not intended to diagnose MRSA infection nor to guide or monitor treatment for MRSA infections.      Studies: No results found.  Scheduled Meds: . aspirin EC  81 mg Oral QPM  . cholecalciferol  1,000 Units Oral q morning - 10a  . lisinopril  5 mg Oral QPM   Continuous Infusions: . sodium chloride 125 mL/hr at 03/06/15 1048  . dextrose 5 % and 0.45% NaCl 100 mL/hr at 03/07/15 0600  . insulin (NOVOLIN-R) infusion 1.9 Units/hr (03/07/15 0600)    Principal Problem:   DKA (diabetic ketoacidoses) Active Problems:   Diabetes mellitus type I   Nausea and vomiting   Chest pain   Hyperglycemia   Essential hypertension    Time spent: 25 minutes    Erick Blinks, MD  [facility] Pager [phone]. If 7PM-7AM, please contact night-coverage at [email or web address], password [facility] 03/07/2015, 7:41 AM  LOS: 1 day     I, Norg'e Tisdol, acting a scribe, recorded this note contemporaneously in the presence of Dr. Erick Blinks, M.D. on 03/07/2015 at  7:52 AM   Attending:  I have reviewed the above documentation for accuracy and completeness, and I agree with the above.  Bob Eastwood

## 2015-08-20 ENCOUNTER — Other Ambulatory Visit (HOSPITAL_COMMUNITY): Payer: Self-pay | Admitting: Obstetrics & Gynecology

## 2015-08-20 DIAGNOSIS — Z1231 Encounter for screening mammogram for malignant neoplasm of breast: Secondary | ICD-10-CM

## 2015-10-01 ENCOUNTER — Ambulatory Visit (HOSPITAL_COMMUNITY)
Admission: RE | Admit: 2015-10-01 | Discharge: 2015-10-01 | Disposition: A | Discharge status: Home / Self Care | Payer: BLUE CROSS/BLUE SHIELD | Source: Ambulatory Visit | Attending: Obstetrics & Gynecology | Admitting: Obstetrics & Gynecology

## 2015-10-01 DIAGNOSIS — Z1231 Encounter for screening mammogram for malignant neoplasm of breast: Secondary | ICD-10-CM | POA: Insufficient documentation

## 2015-10-17 ENCOUNTER — Ambulatory Visit (INDEPENDENT_AMBULATORY_CARE_PROVIDER_SITE_OTHER): Payer: BLUE CROSS/BLUE SHIELD

## 2015-10-17 ENCOUNTER — Ambulatory Visit (INDEPENDENT_AMBULATORY_CARE_PROVIDER_SITE_OTHER): Payer: BLUE CROSS/BLUE SHIELD | Admitting: Orthopaedic Surgery

## 2015-10-17 VITALS — BP 161/74 | HR 80 | Temp 97.5°F | Ht 65.0 in | Wt 185.6 lb

## 2015-10-17 DIAGNOSIS — M25551 Pain in right hip: Secondary | ICD-10-CM | POA: Diagnosis not present

## 2015-10-17 DIAGNOSIS — M5441 Lumbago with sciatica, right side: Secondary | ICD-10-CM | POA: Diagnosis not present

## 2015-10-17 DIAGNOSIS — M7061 Trochanteric bursitis, right hip: Secondary | ICD-10-CM | POA: Diagnosis not present

## 2015-10-17 MED ORDER — DICLOFENAC SODIUM 75 MG PO TBEC: 75.0000 mg | DELAYED_RELEASE_TABLET | Freq: Two times a day (BID) | ORAL | Status: AC

## 2015-10-17 NOTE — Progress Notes (Signed)
Subjective: My right hip hurts and the pain goes down my leg    Patient ID: Helen [name], female    DOB: 17-Oct-1955, 60 y.o.   MRN: [id number]  Hip Pain  The incident occurred more than 1 week ago. There was no injury mechanism. The pain is present in the right hip and right thigh. The quality of the pain is described as aching and shooting. The pain is at a severity of 4/10. The pain is mild. The pain has been fluctuating since onset. Pertinent negatives include no loss of motion, loss of sensation, muscle weakness, numbness or tingling. The symptoms are aggravated by weight bearing. She has tried ice, heat and rest for the symptoms. The treatment provided moderate relief.      Review of Systems  HENT: Negative for congestion.   Respiratory: Negative for shortness of breath.   Cardiovascular: Negative for leg swelling.  Endocrine: Positive for cold intolerance.  Musculoskeletal: Positive for arthralgias and gait problem.  Allergic/Immunologic: Positive for environmental allergies.  Neurological: Negative for tingling and numbness.  All other systems reviewed and are negative.  Social History   Social History  . Marital Status: Married    Spouse Name: N/A  . Number of Children: N/A  . Years of Education: N/A   Occupational History  . Not on file.   Social History Main Topics  . Smoking status: Never Smoker   . Smokeless tobacco: Not on file  . Alcohol Use: No  . Drug Use: No  . Sexual Activity: Not on file   Other Topics Concern  . Not on file   Social History Narrative   Past Surgical History  Procedure Laterality Date  . Cholecystectomy      Past Medical History  Diagnosis Date  . Shingles   . Diabetes mellitus without complication        Objective:   Physical Exam  Constitutional: She is oriented to person, place, and time. She appears well-developed and well-nourished.  HENT:  Head: Normocephalic and atraumatic.  Eyes: Conjunctivae and EOM are  normal. Pupils are equal, round, and reactive to light.  Neck: Normal range of motion. Neck supple.  Cardiovascular: Normal rate, regular rhythm and intact distal pulses.   Pulmonary/Chest: Effort normal.  Musculoskeletal: She exhibits tenderness (She has pain over the right trochanteric bursa.  ROM of the back is full. She has no spasm of the back.  She has no redness of the right hip. ROM of the right hip is full.).       Legs: Neurological: She is alert and oriented to person, place, and time. She has normal reflexes. She displays normal reflexes. No cranial nerve deficit. She exhibits normal muscle tone. Coordination normal.  Skin: Skin is warm and dry.  Psychiatric: She has a normal mood and affect. Her behavior is normal. Judgment and thought content normal.   PROCEDURE NOTE:  The patient request injection, verbal consent was obtained.  The right trochanteric area of the hip was prepped appropriately after time out was performed.   Sterile technique was observed and injection of 1 cc of Depo-Medrol 40 mg with several cc's of plain xylocaine. Anesthesia was provided by ethyl chloride and a 20-gauge needle was used to inject the hip area. The injection was tolerated well.  A band aid dressing was applied.  The patient was advised to apply ice later today and tomorrow to the injection sight as needed.  Her diabetes is well controlled.  She watches her  diet closely.  She likes to walk daily and I have gone over this with her and precautions.     Assessment & Plan:  Right hip trochanteric bursitis. I also feel she has lower back pain with slight sciatica.    I will begin diclofenac and have gone over instructions and precautions.  I have told her she may need further investigation of the lower back depending on how she does with he medicine.  She is to be as active as she can be.  Call if any problem.

## 2015-11-01 ENCOUNTER — Encounter: Payer: Self-pay | Admitting: Orthopaedic Surgery

## 2015-11-01 ENCOUNTER — Ambulatory Visit (INDEPENDENT_AMBULATORY_CARE_PROVIDER_SITE_OTHER): Payer: BLUE CROSS/BLUE SHIELD | Admitting: Orthopaedic Surgery

## 2015-11-01 VITALS — BP 144/87 | HR 73 | Temp 97.3°F | Ht 65.0 in | Wt 186.0 lb

## 2015-11-01 DIAGNOSIS — M7061 Trochanteric bursitis, right hip: Secondary | ICD-10-CM | POA: Diagnosis not present

## 2015-11-01 NOTE — Progress Notes (Signed)
Patient Helen [name], female DOB:January 15, 1956, 60 y.o. JYN:[id number]  Chief Complaint  Patient presents with  . Hip Pain    right side, follow up    HPI  Helen [name] is a 60 y.o. female who has right hip trochanteric bursitis.  I saw her about two weeks ago.  She began diclofenac and exercises and ice.  She says she is about 80% better.  She has pain after riding in a car more than 30 minutes.  She has no pain after walking and has increased her daily walk to one hour every weekday.  She has no paresthesias.  She has no redness. She has no new trauma.  She is pleased with her progress.  I have told her about making some adjustments to her car seat.  She will do this.  HPI  Body mass index is 30.95 kg/(m^2).  Review of Systems  HENT: Negative for congestion.   Respiratory: Negative for shortness of breath.   Cardiovascular: Negative for leg swelling.  Endocrine: Positive for cold intolerance.  Musculoskeletal: Positive for arthralgias and gait problem.  Allergic/Immunologic: Positive for environmental allergies.  Neurological: Negative for numbness.  All other systems reviewed and are negative.   Past Medical History  Diagnosis Date  . Shingles   . Diabetes mellitus without complication [facility])     Past Surgical History  Procedure Laterality Date  . Cholecystectomy      No family history on file.  Social History Social History  Substance Use Topics  . Smoking status: Never Smoker   . Smokeless tobacco: None  . Alcohol Use: No    No Known Allergies  Current Outpatient Prescriptions  Medication Sig Dispense Refill  . aspirin EC 81 MG tablet Take 81 mg by mouth every evening.    . cholecalciferol (VITAMIN D) 1000 UNITS tablet Take 1,000 Units by mouth every morning.    . diclofenac (VOLTAREN) 75 MG EC tablet Take 1 tablet (75 mg total) by mouth 2 (two) times daily with a meal. 60 tablet 2  . Insulin Human (INSULIN PUMP) SOLN Continue insulin pump as  previously prescribed    . lisinopril (PRINIVIL,ZESTRIL) 5 MG tablet Take 5 mg by mouth every evening.    Marland Kitchen NOVOLOG FLEXPEN 100 UNIT/ML injection 0-55 Units by Pump Prime route continuous. Reported on 10/17/2015     No current facility-administered medications for this visit.     Physical Exam  Blood pressure 144/87, pulse 73, temperature 97.3 F (36.3 C), temperature source Tympanic, height  (1.651 m), weight 186 lb (84.369 kg).  Constitutional: overall normal hygiene, normal nutrition, well developed, normal grooming, normal body habitus. Assistive device:none  Musculoskeletal: gait and station Limp none, muscle tone and strength are normal, no tremors or atrophy is present.  .  Neurological: coordination overall normal.  Deep tendon reflex/nerve stretch intact.  Sensation normal.  Cranial nerves II-XII intact.   Skin:   normal overall no scars, lesions, ulcers or rashes. No psoriasis.  Psychiatric: Alert and oriented x 3.  Recent memory intact, remote memory unclear.  Normal mood and affect. Well groomed.  Good eye contact.  Cardiovascular: overall no swelling, no varicosities, no edema bilaterally, normal temperatures of the legs and arms, no clubbing, cyanosis and good capillary refill.  Lymphatic: palpation is normal.   Extremities:she has some pain over the right hip greater trochanter area but minimal.  She has no redness.  She has normal gait. Inspection normal right hip Strength and tone normal Range of  motion full of the right hip.  The patient has been educated about the nature of the problem(s) and counseled on treatment options.  The patient appeared to understand what I have discussed and is in agreement with it.  Encounter Diagnosis  Name Primary?  . Trochanteric bursitis of right hip Yes    PLAN Call if any problems.  Precautions discussed.  Continue current medications.   Return to clinic 1 month

## 2015-11-29 ENCOUNTER — Encounter: Payer: Self-pay | Admitting: Orthopaedic Surgery

## 2015-11-29 ENCOUNTER — Ambulatory Visit (INDEPENDENT_AMBULATORY_CARE_PROVIDER_SITE_OTHER): Payer: BLUE CROSS/BLUE SHIELD | Admitting: Orthopaedic Surgery

## 2015-11-29 VITALS — BP 163/86 | HR 81 | Temp 97.5°F | Ht 65.0 in | Wt 186.0 lb

## 2015-11-29 DIAGNOSIS — M7061 Trochanteric bursitis, right hip: Secondary | ICD-10-CM

## 2015-11-29 NOTE — Progress Notes (Signed)
Patient ZO:XWRU:Helen [name], female DOB:06-Apr-1956, 60 y.o. [id number]  Chief Complaint  Patient presents with  . Follow-up    Right hip pain    HPI  Helen [name] is a 60 y.o. female who has had trochanteric bursitis of the right hip.  She has done well since last seen. She has used a small pillow, moved the seat of her car forward slightly, adjusted her activity, etc as recommended last visit. She has much less pain, she is at 90% of normal she says.  She is active.  She is doing well.  I went over some more precautions for her.  She is to continue her exercises and exercise program.  She may stop her medicine.  HPI  Body mass index is 30.95 kg/(m^2).  Review of Systems  HENT: Negative for congestion.   Respiratory: Negative for shortness of breath.   Cardiovascular: Negative for leg swelling.  Endocrine: Positive for cold intolerance.  Musculoskeletal: Positive for arthralgias and gait problem.  Allergic/Immunologic: Positive for environmental allergies.  Neurological: Negative for numbness.  All other systems reviewed and are negative.   Past Medical History  Diagnosis Date  . Shingles   . Diabetes mellitus without complication [facility])     Past Surgical History  Procedure Laterality Date  . Cholecystectomy      History reviewed. No pertinent family history.  Social History Social History  Substance Use Topics  . Smoking status: Never Smoker   . Smokeless tobacco: None  . Alcohol Use: No    No Known Allergies  Current Outpatient Prescriptions  Medication Sig Dispense Refill  . aspirin EC 81 MG tablet Take 81 mg by mouth every evening.    . cholecalciferol (VITAMIN D) 1000 UNITS tablet Take 1,000 Units by mouth every morning.    . diclofenac (VOLTAREN) 75 MG EC tablet Take 1 tablet (75 mg total) by mouth 2 (two) times daily with a meal. 60 tablet 2  . Insulin Human (INSULIN PUMP) SOLN Continue insulin pump as previously prescribed    . lisinopril  (PRINIVIL,ZESTRIL) 5 MG tablet Take 5 mg by mouth every evening.    Marland Kitchen. NOVOLOG FLEXPEN 100 UNIT/ML injection 0-55 Units by Pump Prime route continuous. Reported on 10/17/2015     No current facility-administered medications for this visit.     Physical Exam  Blood pressure 163/86, pulse 81, temperature 97.5 F (36.4 C), height 5\' 5"  (1.651 m), weight 186 lb (84.369 kg).  Constitutional: overall normal hygiene, normal nutrition, well developed, normal grooming, normal body habitus. Assistive device:none  Musculoskeletal: gait and station Limp none, muscle tone and strength are normal, no tremors or atrophy is present.  .  Neurological: coordination overall normal.  Deep tendon reflex/nerve stretch intact.  Sensation normal.  Cranial nerves II-XII intact.   Skin:   normal overall no scars, lesions, ulcers or rashes. No psoriasis.  Psychiatric: Alert and oriented x 3.  Recent memory intact, remote memory unclear.  Normal mood and affect. Well groomed.  Good eye contact.  Cardiovascular: overall no swelling, no varicosities, no edema bilaterally, normal temperatures of the legs and arms, no clubbing, cyanosis and good capillary refill.  Lymphatic: palpation is normal.  Right Hip Exam  Right hip exam is normal.   Range of Motion  The patient has normal right hip ROM.  Other  Erythema: absent Scars: absent Sensation: normal Pulse: present   Left Hip Exam  Left hip exam is normal.  Range of Motion  The patient has normal  left hip ROM.  Muscle Strength  The patient has normal left hip strength.   Other  Erythema: absent Scars: absent Sensation: normal Pulse: present      The patient has been educated about the nature of the problem(s) and counseled on treatment options.  The patient appeared to understand what I have discussed and is in agreement with it.  Encounter Diagnosis  Name Primary?  . Trochanteric bursitis of right hip Yes    PLAN Call if any  problems.  Precautions discussed.  Continue current medications.   Return to clinic as needed.

## 2016-01-01 ENCOUNTER — Emergency Department (HOSPITAL_COMMUNITY)
Admission: EM | Admit: 2016-01-01 | Discharge: 2016-01-02 | Disposition: A | Discharge status: Home / Self Care | Payer: BLUE CROSS/BLUE SHIELD | Attending: Emergency Medicine | Admitting: Emergency Medicine

## 2016-01-01 ENCOUNTER — Encounter (HOSPITAL_COMMUNITY): Payer: Self-pay | Admitting: Emergency Medicine

## 2016-01-01 DIAGNOSIS — H53149 Visual discomfort, unspecified: Secondary | ICD-10-CM | POA: Diagnosis not present

## 2016-01-01 DIAGNOSIS — E119 Type 2 diabetes mellitus without complications: Secondary | ICD-10-CM | POA: Diagnosis not present

## 2016-01-01 DIAGNOSIS — Z794 Long term (current) use of insulin: Secondary | ICD-10-CM | POA: Diagnosis not present

## 2016-01-01 DIAGNOSIS — Z7982 Long term (current) use of aspirin: Secondary | ICD-10-CM | POA: Diagnosis not present

## 2016-01-01 DIAGNOSIS — G43809 Other migraine, not intractable, without status migrainosus: Secondary | ICD-10-CM | POA: Insufficient documentation

## 2016-01-01 LAB — CBG MONITORING, ED: Glucose-Capillary: 107 mg/dL — ABNORMAL HIGH (ref 65–99)

## 2016-01-01 NOTE — ED Notes (Signed)
Pt has had headache since 4pm and pain down neck. Pt also nauseated.

## 2016-01-01 NOTE — ED Provider Notes (Signed)
CSN: [id number]     Arrival date & time 01/01/16  2248 History  By signing my name below, I, Helen [name], attest that this documentation has been prepared under the direction and in the presence of Gilda Crease, MD. Electronically Signed: Angelene Giovanni, ED Scribe. 01/02/2016. 12:02 AM.     Chief Complaint  Patient presents with  . Headache   Patient is a 60 y.o. female presenting with headaches. The history is provided by the patient. No language interpreter was used.  Headache Pain location:  L temporal and R temporal Quality:  Dull Onset quality:  Sudden Duration:  8 hours Timing:  Constant Progression:  Worsening Chronicity:  New Context: bright light   Relieved by:  None tried Worsened by:  Nothing Ineffective treatments:  None tried Associated symptoms: nausea and photophobia   Associated symptoms: no fever and no numbness    HPI Comments: Helen [name] is a 60 y.o. female with a hx of DM who presents to the Emergency Department complaining of gradually worsening constant dull bilateral temporal HA onset 4 pm yesterday. Pt reports associated pain to the posterior neck pain, nausea, and photophobia. No alleviating factors noted. Pt has not tried any medications PTA. No fever, chills, or numbness.   Past Medical History  Diagnosis Date  . Shingles   . Diabetes mellitus without complication [facility])    Past Surgical History  Procedure Laterality Date  . Cholecystectomy     No family history on file. Social History  Substance Use Topics  . Smoking status: Never Smoker   . Smokeless tobacco: None  . Alcohol Use: No   OB History    No data available     Review of Systems  Constitutional: Negative for fever and chills.  Eyes: Positive for photophobia.  Gastrointestinal: Positive for nausea.  Neurological: Positive for headaches. Negative for numbness.  All other systems reviewed and are negative.     Allergies  Review of patient's allergies  indicates no known allergies.  Home Medications   Prior to Admission medications   Medication Sig Start Date End Date Taking? Authorizing Provider  aspirin EC 81 MG tablet Take 81 mg by mouth every evening.    Historical Provider, MD  cholecalciferol (VITAMIN D) 1000 UNITS tablet Take 1,000 Units by mouth every morning.    Historical Provider, MD  diclofenac (VOLTAREN) 75 MG EC tablet Take 1 tablet (75 mg total) by mouth 2 (two) times daily with a meal. 10/17/15   Darreld Mclean, MD  Insulin Human (INSULIN PUMP) SOLN Continue insulin pump as previously prescribed 03/07/15   Erick Blinks, MD  lisinopril (PRINIVIL,ZESTRIL) 5 MG tablet Take 5 mg by mouth every evening. 06/16/12   Historical Provider, MD  NOVOLOG FLEXPEN 100 UNIT/ML injection 0-55 Units by Pump Prime route continuous. Reported on 10/17/2015 06/18/12   Historical Provider, MD   BP 123/58 mmHg  Pulse 72  Temp(Src) 98.5 F (36.9 C) (Oral)  Resp 16  Ht  (1.651 m)  Wt 160 lb (72.576 kg)  BMI 26.63 kg/m2  SpO2 99% Physical Exam  Constitutional: She is oriented to person, place, and time. She appears well-developed and well-nourished. No distress.  HENT:  Head: Normocephalic and atraumatic.  Right Ear: Hearing normal.  Left Ear: Hearing normal.  Nose: Nose normal.  Mouth/Throat: Oropharynx is clear and moist and mucous membranes are normal.  Eyes: Conjunctivae and EOM are normal. Pupils are equal, round, and reactive to light.  Neck: Normal range of motion.  Neck supple.  No meningismus.   Cardiovascular: Regular rhythm, S1 normal and S2 normal.  Exam reveals no gallop and no friction rub.   No murmur heard. Pulmonary/Chest: Effort normal and breath sounds normal. No respiratory distress. She exhibits no tenderness.  Abdominal: Soft. Normal appearance and bowel sounds are normal. There is no hepatosplenomegaly. There is no tenderness. There is no rebound, no guarding, no tenderness at McBurney's point and negative Murphy's  sign. No hernia.  Musculoskeletal: Normal range of motion. She exhibits tenderness.  Slight left paraspinal cervical tenderness Normal ROM  Neurological: She is alert and oriented to person, place, and time. She has normal strength. No cranial nerve deficit or sensory deficit. Coordination normal. GCS eye subscore is 4. GCS verbal subscore is 5. GCS motor subscore is 6.  Extraocular muscle movement: normal No visual field cut Pupils: equal and reactive both direct and consensual response is normal No nystagmus present    Sensory function is intact to light touch, pinprick Proprioception intact  Grip strength 5/5 symmetric in upper extremities No pronator drift Normal finger to nose bilaterally  Lower extremity strength 5/5 against gravity Normal heel to shin bilaterally  Gait: normal   Skin: Skin is warm, dry and intact. No rash noted. No cyanosis.  Psychiatric: She has a normal mood and affect. Her speech is normal and behavior is normal. Thought content normal.  Nursing note and vitals reviewed.   ED Course  Procedures (including critical care time) DIAGNOSTIC STUDIES: Oxygen Saturation is 98% on RA, normal by my interpretation.    COORDINATION OF CARE: 12:01 AM- Pt advised of plan for treatment and pt agrees. Pt will receive CT head and CBG for further evaluation.    Labs Review Labs Reviewed  CBG MONITORING, ED - Abnormal; Notable for the following:    Glucose-Capillary 107 (*)    All other components within normal limits    Imaging Review Ct Head Wo Contrast  01/02/2016  CLINICAL DATA:  Headache.  Symptoms gradually worsening EXAM: CT HEAD WITHOUT CONTRAST TECHNIQUE: Contiguous axial images were obtained from the base of the skull through the vertex without intravenous contrast. COMPARISON:  None. FINDINGS: Skull and Sinuses:Negative for fracture or destructive process. Negative for sinusitis or mastoiditis. Visualized orbits: Negative. Brain: No evidence of acute  infarction, hemorrhage, hydrocephalus, or mass lesion/mass effect. Mild chronic microvascular disease versus artifact in this patient with history of diabetes. IMPRESSION: No acute finding.  No explanation for headache. Electronically Signed   By: Marnee Spring M.D.   On: 01/02/2016 01:24     Gilda Crease, MD has personally reviewed and evaluated these images and lab results as part of his medical decision-making.   EKG Interpretation None      MDM   Final diagnoses:  Other type of migraine   Patient presents to the ER for evaluation of headache. Patient reports that pain began at 4 PM. She is mostly having frontal and bitemporal pain. Pain does seem to go down the back of her neck as well. She does not have a history of migraines. Symptoms did, however, resemble migraine. She had headache, photosensitivity, nausea and vomiting. She had a normal neurologic examination. Because she was expressing acute headache, CT scan was performed. CT was negative. She did present in the acute phase of the headache, it is not felt she requires any further imaging. It is not felt that she requires lumbar puncture for further evaluation of subarachnoid hemorrhage. Patient had resolution of her headache with  migraine cocktail. This is reassuring. Patient will be discharged, follow-up with neurology.  I personally performed the services described in this documentation, which was scribed in my presence. The recorded information has been reviewed and is accurate.     Gilda Creasehristopher J Pollina, MD 01/02/16 [phone]

## 2016-01-02 ENCOUNTER — Emergency Department (HOSPITAL_COMMUNITY): Payer: BLUE CROSS/BLUE SHIELD

## 2016-01-02 DIAGNOSIS — G43809 Other migraine, not intractable, without status migrainosus: Secondary | ICD-10-CM | POA: Diagnosis not present

## 2016-01-02 MED ORDER — KETOROLAC TROMETHAMINE 30 MG/ML IJ SOLN
30.0000 mg | Freq: Once | INTRAMUSCULAR | Status: AC
  Administered 2016-01-02: 30 mg via INTRAMUSCULAR
  Filled 2016-01-02: qty 1

## 2016-01-02 MED ORDER — PROCHLORPERAZINE EDISYLATE 5 MG/ML IJ SOLN
10.0000 mg | Freq: Once | INTRAMUSCULAR | Status: AC
  Administered 2016-01-02: 10 mg via INTRAMUSCULAR
  Filled 2016-01-02: qty 2

## 2016-01-02 MED ORDER — DIPHENHYDRAMINE HCL 25 MG PO CAPS
50.0000 mg | ORAL_CAPSULE | Freq: Once | ORAL | Status: AC
  Administered 2016-01-02: 50 mg via ORAL
  Filled 2016-01-02: qty 2

## 2016-01-02 NOTE — Discharge Instructions (Signed)

## 2016-08-27 ENCOUNTER — Other Ambulatory Visit: Payer: Self-pay | Admitting: Obstetrics & Gynecology

## 2016-08-27 DIAGNOSIS — Z1231 Encounter for screening mammogram for malignant neoplasm of breast: Secondary | ICD-10-CM

## 2016-10-02 ENCOUNTER — Ambulatory Visit (HOSPITAL_COMMUNITY): Payer: BLUE CROSS/BLUE SHIELD

## 2016-10-13 ENCOUNTER — Ambulatory Visit (HOSPITAL_COMMUNITY): Payer: BLUE CROSS/BLUE SHIELD

## 2016-10-16 IMAGING — CT CT HEAD W/O CM
4 series · 16 of 47 positions shown, 18 images · non-contrast
Comparison: None.

CLINICAL DATA: Headache.  Symptoms gradually worsening

EXAM:
CT HEAD WITHOUT CONTRAST
TECHNIQUE: Contiguous axial images were obtained from the base of the skull
through the vertex without intravenous contrast.

[Series 2: head w/o · axial · non-contrast · 0.42mm/px · z∈[+1168,+1280]mm · 8 of 37 slices shown, 10 images]
[im 5/37  brain]
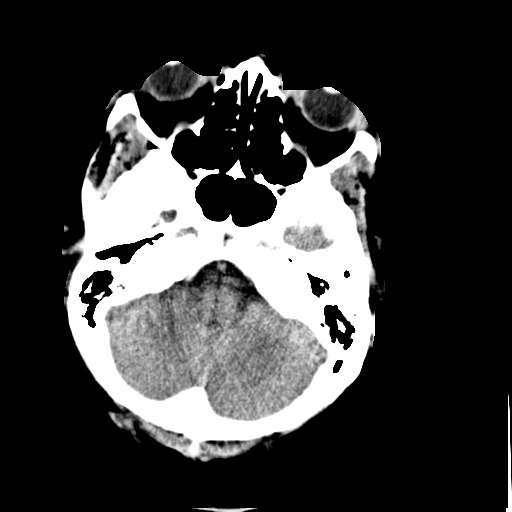
[im 5/37  bone]
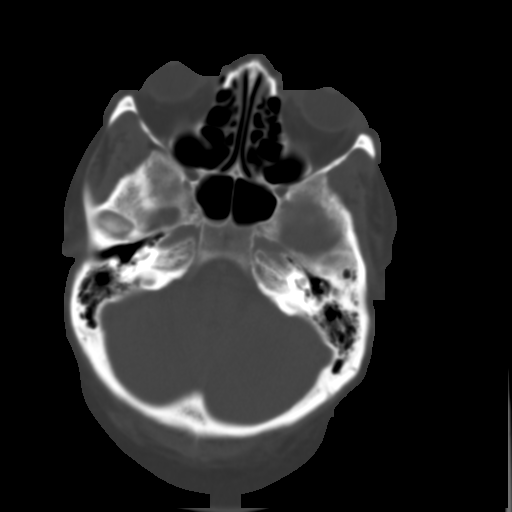
[im 9/37  brain]
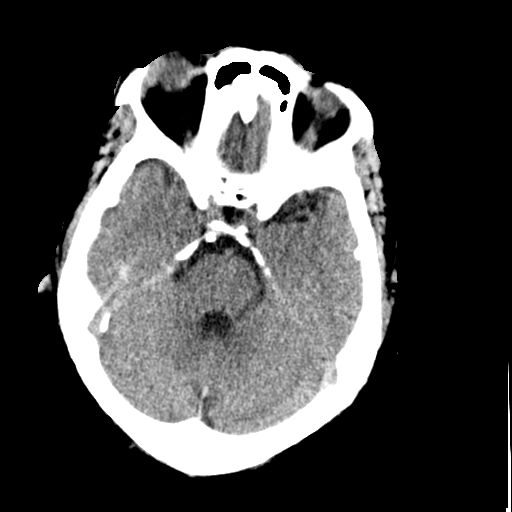
[im 13/37  brain]
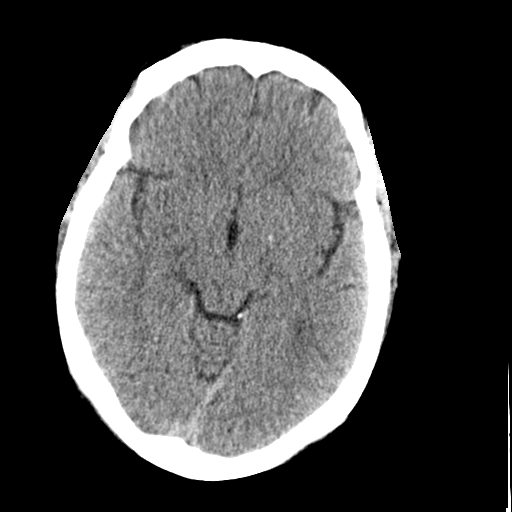
[im 17/37  brain]
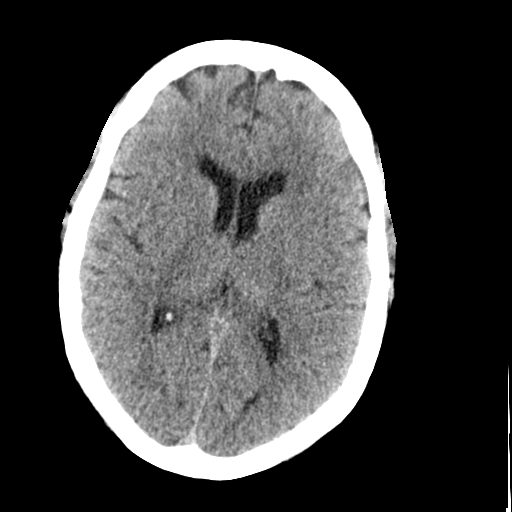
[im 21/37  brain]
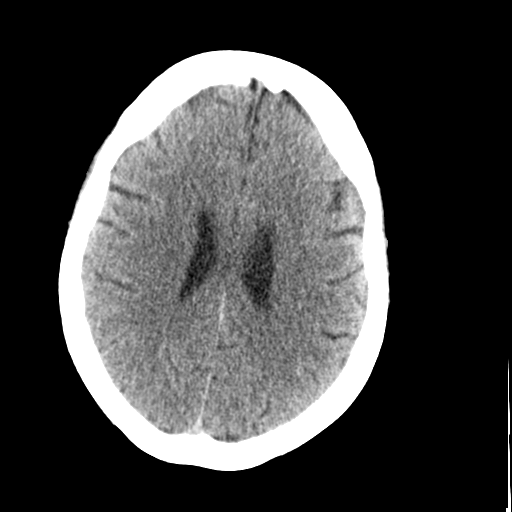
[im 21/37  bone]
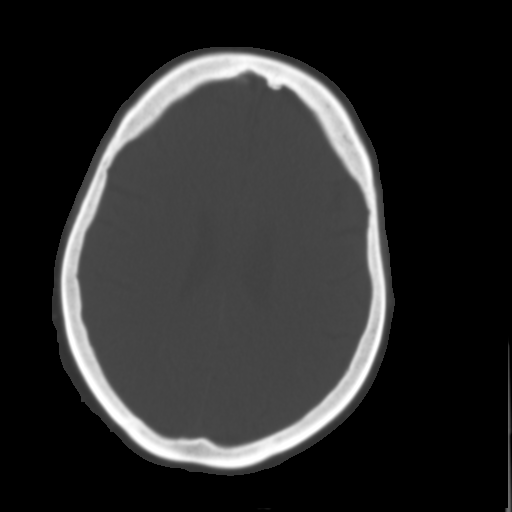
[im 25/37  brain]
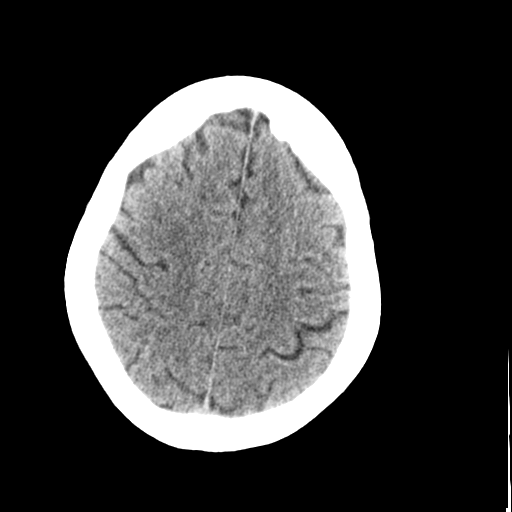
[im 29/37  brain]
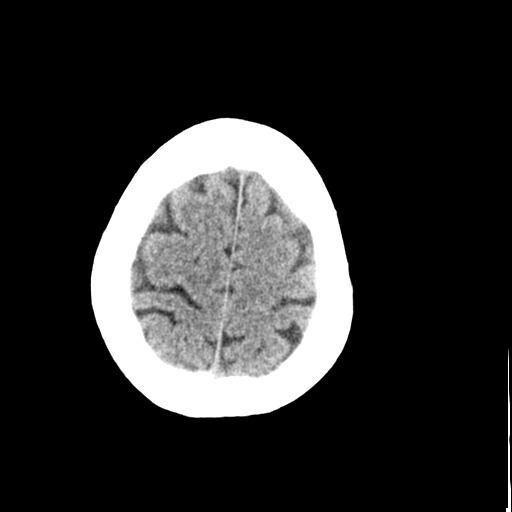
[im 33/37  brain]
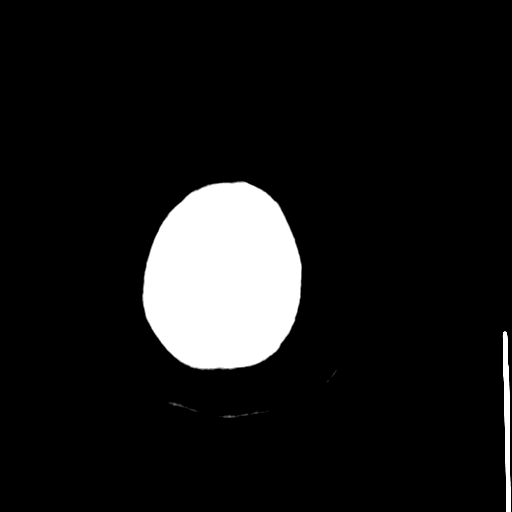

[Series 3: head bone · axial · 0.42mm/px · z∈[+1166,+1182]mm · 2 of 74 slices shown]
[im 8/74  bone]
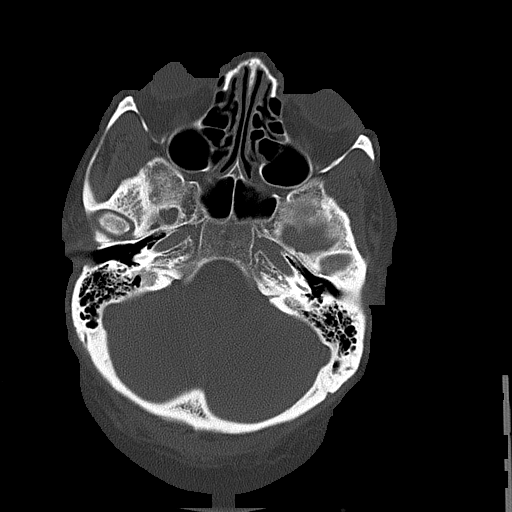
[im 16/74  bone]
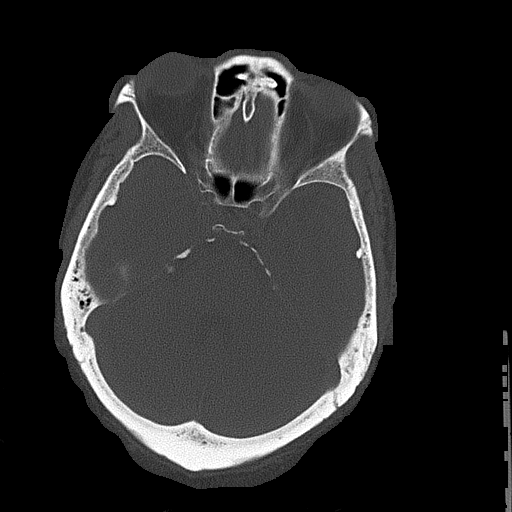

[Series 4: coronal · coronal · 0.31mm/px · 3 of 67 slices shown]
[im 23/67  brain]
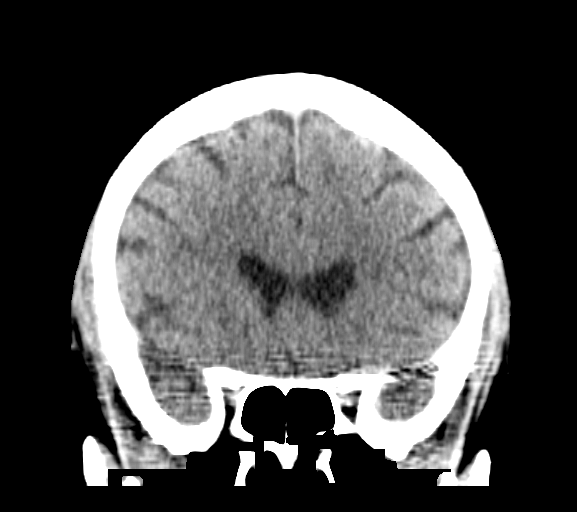
[im 30/67  brain]
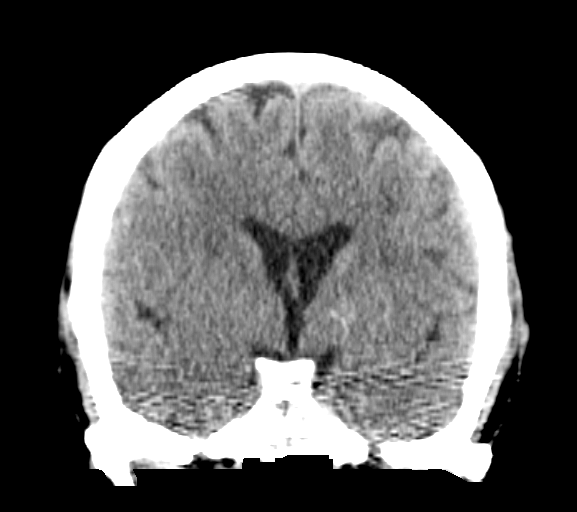
[im 37/67  brain]
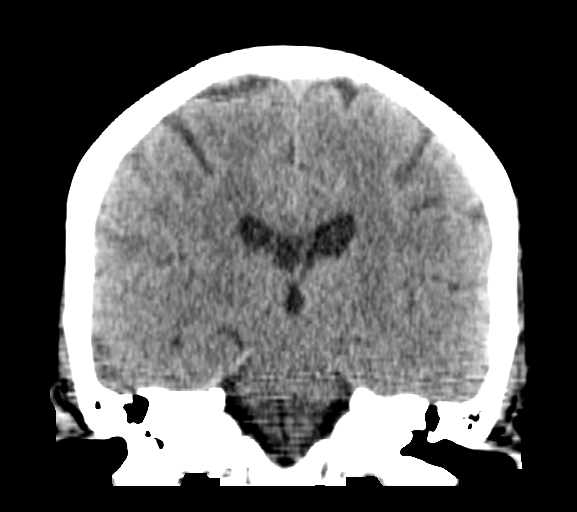

[Series 5: sagittal · sagittal · 0.29mm/px · 3 of 54 slices shown]
[im 18/54  brain]
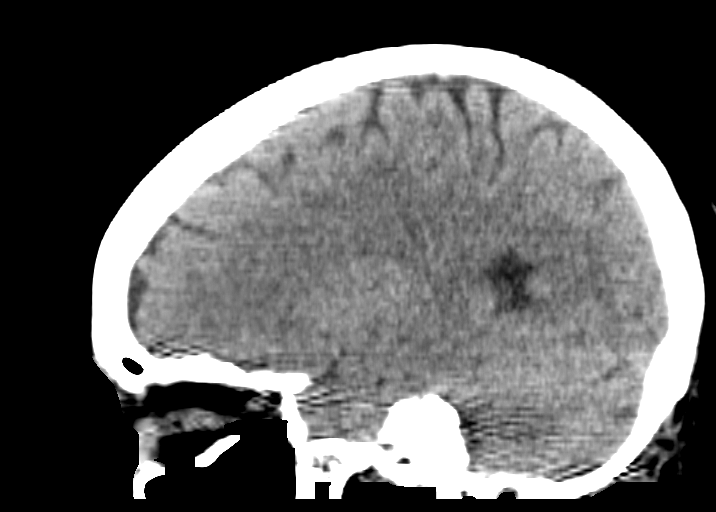
[im 27/54  brain]
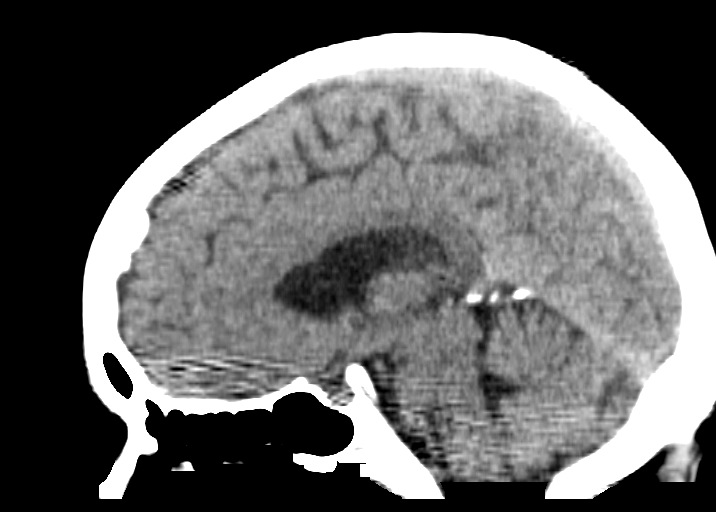
[im 36/54  brain]
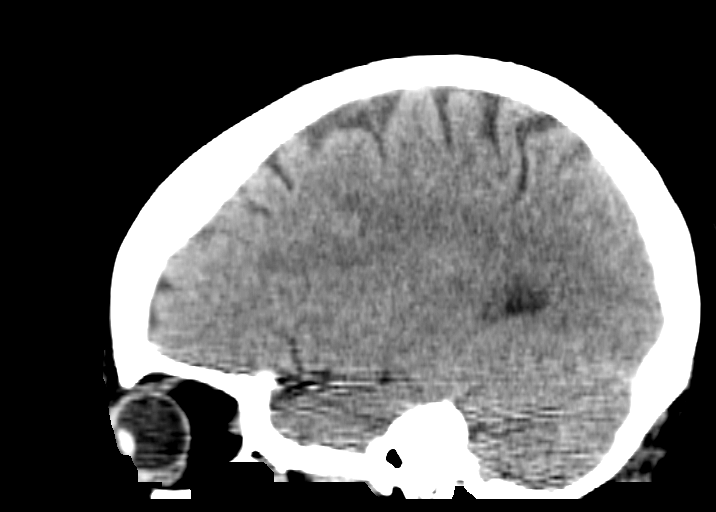

[16 of 47 positions shown; findings below may reference images not displayed]

FINDINGS: Skull and Sinuses:Negative for fracture or destructive process.
Negative for sinusitis or mastoiditis.

Visualized orbits: Negative.

Brain: No evidence of acute infarction, hemorrhage, hydrocephalus,
or mass lesion/mass effect. Mild chronic microvascular disease
versus artifact in this patient with history of diabetes.
IMPRESSION: No acute finding.  No explanation for headache.

## 2016-12-15 ENCOUNTER — Ambulatory Visit (HOSPITAL_COMMUNITY)
Admission: RE | Admit: 2016-12-15 | Discharge: 2016-12-15 | Disposition: A | Discharge status: Home / Self Care | Payer: BLUE CROSS/BLUE SHIELD | Source: Ambulatory Visit | Attending: Obstetrics & Gynecology | Admitting: Obstetrics & Gynecology

## 2016-12-15 DIAGNOSIS — Z1231 Encounter for screening mammogram for malignant neoplasm of breast: Secondary | ICD-10-CM | POA: Diagnosis not present
